# Patient Record
Sex: Female | Born: 1955 | Race: Asian | Hispanic: No | Marital: Married | Smoking: Never smoker
Health system: Southern US, Community
[De-identification: ages and names within clinical notes are randomized; demographics above are authoritative.]

## PROBLEM LIST (undated history)

## (undated) DIAGNOSIS — M1990 Unspecified osteoarthritis, unspecified site: Secondary | ICD-10-CM

## (undated) DIAGNOSIS — K219 Gastro-esophageal reflux disease without esophagitis: Secondary | ICD-10-CM

## (undated) HISTORY — DX: Gastro-esophageal reflux disease without esophagitis: K21.9

## (undated) HISTORY — DX: Unspecified osteoarthritis, unspecified site: M19.90

---

## 1999-12-22 ENCOUNTER — Inpatient Hospital Stay (HOSPITAL_COMMUNITY): Admission: AD | Admit: 1999-12-22 | Discharge: 1999-12-25 | Payer: Self-pay | Admitting: *Deleted

## 2000-01-30 ENCOUNTER — Other Ambulatory Visit: Admission: RE | Admit: 2000-01-30 | Discharge: 2000-01-30 | Payer: Self-pay | Admitting: *Deleted

## 2004-06-20 ENCOUNTER — Ambulatory Visit: Payer: Self-pay | Admitting: Family Medicine

## 2004-08-20 ENCOUNTER — Ambulatory Visit: Payer: Self-pay | Admitting: Family Medicine

## 2004-09-11 ENCOUNTER — Ambulatory Visit: Payer: Self-pay | Admitting: Family Medicine

## 2004-12-03 ENCOUNTER — Ambulatory Visit: Payer: Self-pay | Admitting: Family Medicine

## 2005-02-24 ENCOUNTER — Ambulatory Visit: Payer: Self-pay | Admitting: Family Medicine

## 2005-10-25 ENCOUNTER — Emergency Department (HOSPITAL_COMMUNITY): Admission: EM | Admit: 2005-10-25 | Discharge: 2005-10-25 | Payer: Self-pay | Admitting: Family Medicine

## 2005-10-28 ENCOUNTER — Emergency Department (HOSPITAL_COMMUNITY): Admission: EM | Admit: 2005-10-28 | Discharge: 2005-10-28 | Payer: Self-pay | Admitting: Family Medicine

## 2006-05-04 ENCOUNTER — Emergency Department (HOSPITAL_COMMUNITY): Admission: EM | Admit: 2006-05-04 | Discharge: 2006-05-04 | Payer: Self-pay | Admitting: Emergency Medicine

## 2006-11-22 ENCOUNTER — Emergency Department (HOSPITAL_COMMUNITY): Admission: EM | Admit: 2006-11-22 | Discharge: 2006-11-23 | Payer: Self-pay | Admitting: Emergency Medicine

## 2010-07-01 ENCOUNTER — Emergency Department (HOSPITAL_COMMUNITY)
Admission: EM | Admit: 2010-07-01 | Discharge: 2010-07-01 | Payer: Self-pay | Source: Home / Self Care | Admitting: Emergency Medicine

## 2010-10-28 LAB — CBC
MCH: 25.8 pg — ABNORMAL LOW (ref 26.0–34.0)
MCHC: 33.8 g/dL (ref 30.0–36.0)
MCV: 76.4 fL — ABNORMAL LOW (ref 78.0–100.0)
Platelets: 276 10*3/uL (ref 150–400)
RBC: 5.81 MIL/uL — ABNORMAL HIGH (ref 3.87–5.11)

## 2010-10-28 LAB — COMPREHENSIVE METABOLIC PANEL
BUN: 7 mg/dL (ref 6–23)
CO2: 24 mEq/L (ref 19–32)
Calcium: 9.8 mg/dL (ref 8.4–10.5)
Chloride: 105 mEq/L (ref 96–112)
Creatinine, Ser: 0.73 mg/dL (ref 0.4–1.2)
GFR calc non Af Amer: >60 mL/min (ref >60)
Total Bilirubin: 1 mg/dL (ref 0.3–1.2)

## 2010-10-28 LAB — POCT URINALYSIS DIPSTICK
Bilirubin Urine: NEGATIVE
Nitrite: NEGATIVE
Protein, ur: NEGATIVE mg/dL
pH: 7 (ref 5.0–8.0)

## 2010-10-28 LAB — D-DIMER, QUANTITATIVE: D-Dimer, Quant: <0.22 ug/mL-FEU (ref 0.00–0.48)

## 2010-10-28 LAB — DIFFERENTIAL
Basophils Absolute: 0 10*3/uL (ref 0.0–0.1)
Lymphocytes Relative: 27 % (ref 12–46)
Lymphs Abs: 2.4 10*3/uL (ref 0.7–4.0)
Neutro Abs: 5.9 10*3/uL (ref 1.7–7.7)

## 2010-10-28 LAB — LIPASE, BLOOD: Lipase: 27 U/L (ref 11–59)

## 2011-01-02 NOTE — Op Note (Signed)
[facility]  Patient:    Debbie Proctor, Debbie Proctor                              MRN: [id number] Proc. Date: 12/22/99 Adm. Date:  [id number] Attending:  Donne Hazel                           Operative Report  PREOPERATIVE DIAGNOSIS:       Intrauterine pregnancy at term.  Breech presentation.  POSTOPERATIVE DIAGNOSIS:      Intrauterine pregnancy at term.  Breech presentation.  OPERATION:                    Primary low transverse cesarean section.  SURGEON:                      Willey Blade, M.D.  ASSISTANT:  ANESTHESIA:                   Spinal.  ESTIMATED BLOOD LOSS:         800 cc.  COMPLICATIONS:                None.  FINDINGS:                     At 0815 through a low transverse uterine incision, a viable female infant was delivered from the frank breech presentation.  The baby as a female weighing 6 pounds 13 ounces.  The babys Apgars were 8 and 9.  The pelvis was visualized at the time of surgery and noted to be normal.  DESCRIPTION OF PROCEDURE:     The patient was taken to the operating room where a spinal anesthetic was administered.  The patient was placed on the operating table in the left lateral tilt position.  The abdomen was prepped and draped in the usual sterile fashion with Betadine and sterile drapes.  A Foley catheter was sterilely inserted.  An adequate level of spinal anesthesia was identified.  Next, the abdomen was entered through a Pfannenstiel incision and carried down sharply in the usual fashion.  The peritoneum was atraumatically entered.  The vesicouterine peritoneum overlying the lower uterine segment was incised and a bladder flap was bluntly and sharply created over the lower uterine segment.  Next, a bladder blade was placed behind the bladder.  The uterus was entered through a low transverse  incision and carried out laterally using the operators fingers.  The membranes were entered with clear fluid noted.  The  frank breech was encountered and elevated into the incision and delivered promptly and easily at 0815 with the usual breech maneuvers.  The baby did well.  The oral and nasopharynx were thoroughly bulb suctioned, the cord doubly clamped and cut, and the baby handed promptly to the  pediatricians.  The baby had a strong spontaneous cry.  The baby was a female weighing 6 pounds 13 ounces, Apgars were 8 and 9.  The placenta was then manually extracted intact with three vessel cord without difficulty.  The interior of the uterus was wiped clean with a wet sponge.  The  uterine incision was then closed in a two layer fashion, the first layer a running interlocking suture of #1 Vicryl.  A second imbricating suture was placed across the primary suture line with a running stitch of #1 Vicryl as  well.  Good hemostasis was noted.  The pelvis was then thoroughly irrigated with copious amounts of irrigant.  The pelvis was visualized and noted to be normal.  Good hemostasis was once again noted and attention then turned to closure.  The rectus muscle and anterior peritoneum was closed with a running stitch of #1 Vicryl. The fascia was then closed with two sutures of #1 Vicryl, this was done by placing wo sutures of #1 Vicryl at the periphery and with a running stitch 0.2 meeting in he midline.  The subfascial areas were of course hemostatic before this.  Good hemostasis was noted in the subcutaneous tissue.  This was irrigated and hemostasis achieved with the cautery.  The skin reapproximated with staples and a sterile dressing applied.  Final sponge, needle, and instrument counts were correct x 3. The patient did receive an antibiotic after cord clamp. DD:  12/22/99 TD:  12/23/99 Job: [id number]

## 2011-01-02 NOTE — Discharge Summary (Signed)
[facility]  Patient:    ELSPETH, BLUCHER                              MRN: [id number] Adm. Date:  12/22/99 Disc. Date: 12/25/99 Attending:  Juluis Mire, M.D. Dictator:   Danie Chandler, R.N.                           Discharge Summary  ADMISSION DIAGNOSES:          1. Intrauterine pregnancy at term.                               2. Breech presentation.  DISCHARGE DIAGNOSES:          1. Intrauterine pregnancy at term.                               2. Breech presentation.  PROCEDURES:                   On Dec 22, 1999, primary low transverse cesarean section.  HISTORY OF PRESENT ILLNESS:   The patient is a 55 year old female, gravida 5, para 2, with an estimated date of confinement of Dec 24, 1999, who was admitted for a primary cesarean section secondary to breech presentation.  The patients pregnancy has been uncomplicated, except for advanced maternal age for which she declined work-up.  HOSPITAL COURSE:              The patient was taken to the operating room and underwent the above-named procedure without complication.  This was productive of a viable female infant with Apgars of 8 at one minute and 9 at five minutes. Postoperatively, the patient did well.  On postoperative day #1, the patients hemoglobin was 9.8, hematocrit 28.1, and white blood cell count 9.6.  The patient was started on iron daily.  On postoperative day #2, the patient was ambulating well without difficulty and had a good return of bowel function. She was tolerating a regular diet.  DISPOSITION:                  The patient was discharged home on postoperative day #3.  CONDITION ON DISCHARGE:       Good.  DIET:                         Regular as tolerated.  ACTIVITY:                     No heavy lifting, no driving, and no vaginal entry.  FOLLOW-UP:                    She is to follow up in the office in one to two weeks for incision check.  She is to call for temperature  greater than 100 degrees, persistent nausea or vomiting, heavy vaginal bleeding, and/or redness or drainage from the incision site.  DISCHARGE MEDICATIONS: 1. Prenatal vitamins one p.o. q.d. 2. Darvocet-N 100 one p.o. q.4h. p.r.n. pain. DD:  01/07/00 TD:  01/10/00 Job: [id number]

## 2012-08-25 ENCOUNTER — Encounter: Payer: Self-pay | Admitting: Family Medicine

## 2012-08-25 ENCOUNTER — Ambulatory Visit (INDEPENDENT_AMBULATORY_CARE_PROVIDER_SITE_OTHER): Payer: 59 | Admitting: Family Medicine

## 2012-08-25 VITALS — BP 130/80 | HR 83 | Ht 63.0 in | Wt 160.0 lb

## 2012-08-25 DIAGNOSIS — R3989 Other symptoms and signs involving the genitourinary system: Secondary | ICD-10-CM

## 2012-08-25 DIAGNOSIS — M25519 Pain in unspecified shoulder: Secondary | ICD-10-CM | POA: Insufficient documentation

## 2012-08-25 DIAGNOSIS — E669 Obesity, unspecified: Secondary | ICD-10-CM

## 2012-08-25 DIAGNOSIS — R109 Unspecified abdominal pain: Secondary | ICD-10-CM

## 2012-08-25 DIAGNOSIS — G5602 Carpal tunnel syndrome, left upper limb: Secondary | ICD-10-CM | POA: Insufficient documentation

## 2012-08-25 DIAGNOSIS — R39198 Other difficulties with micturition: Secondary | ICD-10-CM | POA: Insufficient documentation

## 2012-08-25 DIAGNOSIS — K219 Gastro-esophageal reflux disease without esophagitis: Secondary | ICD-10-CM | POA: Insufficient documentation

## 2012-08-25 DIAGNOSIS — G5600 Carpal tunnel syndrome, unspecified upper limb: Secondary | ICD-10-CM

## 2012-08-25 LAB — CBC WITH DIFFERENTIAL/PLATELET
Eosinophils Absolute: 0.2 10*3/uL (ref 0.0–0.7)
Eosinophils Relative: 1 % (ref 0–5)
Lymphs Abs: 2.9 10*3/uL (ref 0.7–4.0)
MCH: 25.2 pg — ABNORMAL LOW (ref 26.0–34.0)
MCHC: 34.3 g/dL (ref 30.0–36.0)
MCV: 73.4 fL — ABNORMAL LOW (ref 78.0–100.0)
Platelets: 323 10*3/uL (ref 150–400)
RBC: 5.56 MIL/uL — ABNORMAL HIGH (ref 3.87–5.11)

## 2012-08-25 LAB — COMPREHENSIVE METABOLIC PANEL
ALT: 13 U/L (ref 0–35)
AST: 18 U/L (ref 0–37)
Alkaline Phosphatase: 47 U/L (ref 39–117)
Glucose, Bld: 75 mg/dL (ref 70–99)
Sodium: 139 mEq/L (ref 135–145)
Total Bilirubin: 0.7 mg/dL (ref 0.3–1.2)
Total Protein: 8.2 g/dL (ref 6.0–8.3)

## 2012-08-25 LAB — POCT UA - MICROSCOPIC ONLY

## 2012-08-25 LAB — POCT URINALYSIS DIPSTICK
Glucose, UA: 100
Leukocytes, UA: NEGATIVE
Nitrite, UA: NEGATIVE
Protein, UA: NEGATIVE
Urobilinogen, UA: 0.2

## 2012-08-25 MED ORDER — DICLOFENAC SODIUM 1 % TD GEL: 2.0000 g | Freq: Four times a day (QID) | TRANSDERMAL | Status: DC

## 2012-08-25 MED ORDER — CYCLOBENZAPRINE HCL 10 MG PO TABS: 10.0000 mg | ORAL_TABLET | Freq: Every evening | ORAL | Status: DC | PRN

## 2012-08-25 MED ORDER — PANTOPRAZOLE SODIUM 40 MG PO TBEC: 40.0000 mg | DELAYED_RELEASE_TABLET | Freq: Every day | ORAL | Status: DC

## 2012-08-25 NOTE — Patient Instructions (Addendum)
The name of the gel to put on the back is called volteran gel, but if it is too expensive, you can use an over the counter medicine called aspercreme.  Also for the muscles, I am sending a muscle relaxant called flexeril to take at night time. You can continue taking ibuprofen but it might make the heartburn worst, so make sure and take it with food.   For the heartburn, I am sending a medicine called protonix to take once a day. If this is too expensive, you can get an over the counter medicine called ranitidine or zantac twice a day.   For the wrist pain, use the brace during the night.   I will call you if any of the lab work is abnormal.   Please come back in 1 month.

## 2012-08-25 NOTE — Assessment & Plan Note (Addendum)
Symptoms of burning after eating consistent with GERD. Will empirically treat with protonix and assess for improvement. If persists, consider H-pylori test. Follow up in 1 month.

## 2012-08-25 NOTE — Assessment & Plan Note (Signed)
Phallen and tinnel positive making carpal tunnel very likely etiology for numbness and tingling in left hand.  Fitted patient for wrist brace for stabilization of wrist. Use at night time and during the day, if able to tolerate wrist brace.

## 2012-08-25 NOTE — Assessment & Plan Note (Signed)
Bilateral shoulder and upper back pain. No neuro findings. Trapezius muscles were tight bilaterally. Recommended exercises for strengthening of the rotator cuff. Rx for flexeril 10mg  qhs/prn and for voltaren gel. If too expensive, aspercreme. Ibuprofen as needed although did caution of increase possibility of heartburn symptoms. Recommended food with NSAID.

## 2012-08-25 NOTE — Assessment & Plan Note (Addendum)
No associated incontinence. Problem has been ongoing for over one year with some more recent worsening of symptoms. Will obtain urine for any acute findings. Follow up in 1 month

## 2012-08-25 NOTE — Progress Notes (Signed)
Patient ID: Debbie Proctor, female   DOB: 1956/01/09, 57 y.o.   MRN: [id number]  Chief Complaint  Patient presents with  . NP  . bilat shoulder pain    HPI Debbie Proctor is a 57 y.o.female who presents as a new patient. presents with following concerns. - bilateral shoulder pain: since October. Worst with movement. Takes ibuprofen which has helped some. Massaging area helps. No weakness in arms. Was see for this and was told it was from muscle strain.  - tingling in left hand: in first four fingers, not pinky. Since October. 5-10 minutes. Every day. Better with massaging. Worst in the morning when waking up.  - heartburn: worst after eating.radiates to back at times No vomiting. Some dizziness. Taking tums, but doesn't help. Has had heartburn since 2008.   Does have some constipation. BM normally once a day. Goes in small amounts. Brown color. No blood.  - discomfort when she tries to urinate. Urinates only small amount. Over a year. Past Medical History  Diagnosis Date  . GERD (gastroesophageal reflux disease)     Past Surgical History  Procedure Date  . Cesarean section 2001    baby was breeched    No family history on file.  Social History History  Substance Use Topics  . Smoking status: Never Smoker   . Smokeless tobacco: Not on file  . Alcohol Use: No    Allergies not on file  Current Outpatient Prescriptions  Medication Sig Dispense Refill  . cyclobenzaprine (FLEXERIL) 10 MG tablet Take 1 tablet (10 mg total) by mouth at bedtime as needed for muscle spasms.  30 tablet  0  . diclofenac sodium (VOLTAREN) 1 % GEL Apply 2 g topically 4 (four) times daily.  1 Tube  0  . pantoprazole (PROTONIX) 40 MG tablet Take 1 tablet (40 mg total) by mouth daily.  30 tablet  3    Review of Systems Review of Systems Negative except per HPI Blood pressure 130/80, pulse 83, height 5\' 3"  (1.6 m), weight 160 lb (72.576 kg).  Physical Exam Physical Exam Physical Examination: General appearance - alert,  well appearing, and in no distress Chest - clear to auscultation, no wheezes, rales or rhonchi, symmetric air entry Heart - normal rate, regular rhythm, normal S1, S2, no murmurs, rubs, clicks or gallops Abdomen - soft, non distended, present bowel sounds, epigastric tenderness to palpation Back exam - tight trapezius bilaterally, tenderness to palpation along traps. No cervical spine point tenderness, normal shoulder range of motion, 5/5 strength in upper extremities bilaterally, some discomfort with scratch test.  Wrist: positive phallen and tinnel in left wrist.    Data Reviewed None available  Assessment    See problem list    Plan    See problem list       Marena Chancy 08/25/2012, 8:53 PM

## 2012-09-09 ENCOUNTER — Encounter: Payer: Self-pay | Admitting: Family Medicine

## 2012-09-09 ENCOUNTER — Ambulatory Visit (INDEPENDENT_AMBULATORY_CARE_PROVIDER_SITE_OTHER): Payer: 59 | Admitting: Family Medicine

## 2012-09-09 VITALS — BP 132/87 | HR 73 | Ht 63.0 in | Wt 160.5 lb

## 2012-09-09 DIAGNOSIS — Z309 Encounter for contraceptive management, unspecified: Secondary | ICD-10-CM

## 2012-09-09 DIAGNOSIS — Z30017 Encounter for initial prescription of implantable subdermal contraceptive: Secondary | ICD-10-CM

## 2012-09-09 DIAGNOSIS — IMO0001 Reserved for inherently not codable concepts without codable children: Secondary | ICD-10-CM

## 2012-09-09 DIAGNOSIS — Z3046 Encounter for surveillance of implantable subdermal contraceptive: Secondary | ICD-10-CM

## 2012-09-09 MED ORDER — ETONOGESTREL 68 MG ~~LOC~~ IMPL
68.0000 mg | DRUG_IMPLANT | Freq: Once | SUBCUTANEOUS | Status: AC
  Administered 2012-09-09: 68 mg via SUBCUTANEOUS

## 2012-09-10 NOTE — Progress Notes (Signed)
Patient ID: Debbie Proctor    DOB: 1956-04-04, 57 y.o.   MRN: [id number] --- Subjective:  Debbie Proctor is a 57 y.o.female who presents for removal and insertion of nexplanon.  Patient states that she is not actually 57yo and was assigned a birth date when she moved to the [place]. She is in fact still having regular periods, q month, while on the implanon and was having regular period prior to insertion 3 years ago.(Implanon inserted in July 2010). LMP: start: 01/10-01/17   ROS: see HPI Past Medical History: reviewed and updated medications and allergies. Social History: Tobacco: none  Objective: Filed Vitals:   09/09/12 1600  BP: 132/87  Pulse: 73   Implanon or Nexplanon Removal  Patient given informed consent for removal of her Nexplanon.  Signed copy in the chart.  Time out recorded.  Implanon site identified and able to be palpated.   Area prepped in usual sterile fashon.  One cc of 1% lidocaine was used to anesthetize the area at the distal end of the implant. A small stab incision was made near implant on the distal portion.   The implanon rod was grasped using hemostats and removed without difficulty.   Rod measured at 4cm to ensure complete removal.   Steri-strips were applied over the small incision.   A pressure bandage was applied to reduce any bruising.   There was less than 3 cc blood loss. There were no complications.  The patient tolerated the procedure well and was given post procedure instructions.   Nexplanon Insertion  Indications, contraindications, side effects, complications reviewed.  Non-dominant arm identified and marked for insertion. Time out performed.  Nexplanon was inserted in prior incision made for removal of implanon.  Implanon inserted as per package insert.   Patient tolerated procedure well. Negligible blood loss.  Steri-strip used to close site, pressure bandage applied.  Provider and patient able to palpate rod.  Given aftercare  instructions.

## 2012-09-11 DIAGNOSIS — Z30017 Encounter for initial prescription of implantable subdermal contraceptive: Secondary | ICD-10-CM | POA: Insufficient documentation

## 2012-09-11 NOTE — Assessment & Plan Note (Signed)
implanon inserted in July 2010 was removed.  Question regarding nexplanon insertion came up regarding patient's age, listed as 57 yo. It turns out that this birth date was randomly chosen when patient came to [place]. Patient thinks she really is in her mid 61's. With patient's report of having monthly bleeding both before implanon was insrted 3 years ago, as well as during the time that implanon has been in, it doesn't appear that patient is menopausal or peri-menopausal. Discussed with Dr. Earnest Bailey and nexplanon insertion is reasonable.

## 2012-09-27 ENCOUNTER — Ambulatory Visit (INDEPENDENT_AMBULATORY_CARE_PROVIDER_SITE_OTHER): Payer: 59 | Admitting: Family Medicine

## 2012-09-27 ENCOUNTER — Encounter: Payer: Self-pay | Admitting: Family Medicine

## 2012-09-27 VITALS — BP 148/93 | HR 85 | Ht 63.0 in | Wt 158.0 lb

## 2012-09-27 DIAGNOSIS — K219 Gastro-esophageal reflux disease without esophagitis: Secondary | ICD-10-CM

## 2012-09-27 DIAGNOSIS — R12 Heartburn: Secondary | ICD-10-CM

## 2012-09-27 DIAGNOSIS — M25519 Pain in unspecified shoulder: Secondary | ICD-10-CM

## 2012-09-27 LAB — POCT H PYLORI SCREEN: H Pylori Screen, POC: NEGATIVE

## 2012-09-27 MED ORDER — TRAMADOL HCL 50 MG PO TABS: 50.0000 mg | ORAL_TABLET | Freq: Four times a day (QID) | ORAL | Status: DC | PRN

## 2012-09-27 MED ORDER — CYCLOBENZAPRINE HCL 10 MG PO TABS: 10.0000 mg | ORAL_TABLET | Freq: Every evening | ORAL | Status: DC | PRN

## 2012-09-27 MED ORDER — PANTOPRAZOLE SODIUM 40 MG PO TBEC: 40.0000 mg | DELAYED_RELEASE_TABLET | Freq: Every day | ORAL | Status: DC

## 2012-09-27 NOTE — Progress Notes (Signed)
Patient ID: Debbie Proctor    DOB: 08-15-1956, 57 y.o.   MRN: [id number] --- Subjective:  Debbie Proctor is a 57 y.o.female (age not certain and not 56yo as stated in medical record) who presents for follow up on abdominal pain and left shoulder/neck pain.  - heartburn: better with the protonix but not completely resolved. Worst after eating. Worst with spicy foods. No nausea, no vomiting. Discomfort is described as burning sensation. No stuck food. No melena.   - left neck pain and shoulder pain: improved with flexeril but not entirely gone. Tingling in left hand improved as well.  Wearing wrist splint for carpel tunnel which also seems to improve. Ran out of flexeril last Friday and has noticed recurrence of discomfort. No hand weakness, no loss of sensation. Improved range of motion.   ROS: see HPI Past Medical History: reviewed and updated medications and allergies. Social History: Tobacco: none  Objective: Filed Vitals:   09/27/12 1601  BP: 148/93  Pulse: 85   Repeat 130/84  Physical Examination:   General appearance - alert, well appearing, and in no distress Chest - clear to auscultation, no wheezes, rales or rhonchi, symmetric air entry Heart - normal rate, regular rhythm, normal S1, S2, no murmurs, rubs, clicks or gallops Abdomen - soft, tender along epigastric and left upper quadrant region, negative Murphy's, no masses, no organomegally MSK - normal range of motion of shoulders, tenderness along left trapezius, positive left spurling. No upper extremity weakness

## 2012-09-27 NOTE — Patient Instructions (Addendum)
For the shoulder pain, continue with the flexeril. I am adding a pain medicine called tramadol that you can take every 6 hours as needed for pain.   For the abdominal pain, I am sending some more medicine. The bacteria test was negative. I would like to get an ultrasound to make sure that everything is ok with your gallbladder.

## 2012-09-29 NOTE — Assessment & Plan Note (Signed)
Improved with flexeril. There could be a cervical component to pain given positive spurling's. Will add tramadol to regimen and monitor improvement. volteran gel as needed.

## 2012-09-29 NOTE — Assessment & Plan Note (Signed)
H-pylori negative. Given some tenderness along RUQ, will obtain RUQ ultrasound. Refill protonix.

## 2012-10-03 ENCOUNTER — Other Ambulatory Visit: Payer: 59

## 2012-10-04 ENCOUNTER — Other Ambulatory Visit: Payer: 59

## 2012-11-18 ENCOUNTER — Other Ambulatory Visit: Payer: 59

## 2012-11-24 ENCOUNTER — Ambulatory Visit
Admission: RE | Admit: 2012-11-24 | Discharge: 2012-11-24 | Disposition: A | Discharge status: Home / Self Care | Payer: 59 | Source: Ambulatory Visit | Attending: Family Medicine | Admitting: Family Medicine

## 2012-11-24 DIAGNOSIS — R12 Heartburn: Secondary | ICD-10-CM

## 2012-12-13 ENCOUNTER — Encounter: Payer: Self-pay | Admitting: Family Medicine

## 2013-02-10 ENCOUNTER — Ambulatory Visit (INDEPENDENT_AMBULATORY_CARE_PROVIDER_SITE_OTHER): Payer: 59 | Admitting: Family Medicine

## 2013-02-10 ENCOUNTER — Encounter: Payer: Self-pay | Admitting: Family Medicine

## 2013-02-10 VITALS — BP 135/88 | HR 66 | Ht 63.0 in | Wt 153.0 lb

## 2013-02-10 DIAGNOSIS — K219 Gastro-esophageal reflux disease without esophagitis: Secondary | ICD-10-CM

## 2013-02-10 DIAGNOSIS — G5600 Carpal tunnel syndrome, unspecified upper limb: Secondary | ICD-10-CM

## 2013-02-10 DIAGNOSIS — G5602 Carpal tunnel syndrome, left upper limb: Secondary | ICD-10-CM

## 2013-02-10 MED ORDER — POLYETHYLENE GLYCOL 3350 17 GM/SCOOP PO POWD: 17.0000 g | Freq: Every day | ORAL | Status: DC

## 2013-02-10 MED ORDER — PANTOPRAZOLE SODIUM 40 MG PO TBEC: 40.0000 mg | DELAYED_RELEASE_TABLET | Freq: Two times a day (BID) | ORAL | Status: DC

## 2013-02-10 MED ORDER — PANTOPRAZOLE SODIUM 40 MG PO TBEC: 40.0000 mg | DELAYED_RELEASE_TABLET | Freq: Every day | ORAL | Status: DC

## 2013-02-10 NOTE — Assessment & Plan Note (Signed)
Stopped wearing brace once symptoms improved. Now having recurrence of symptoms. Recommended wearing brace nightly. If this doesn't help, will refer for steroid injection.

## 2013-02-10 NOTE — Patient Instructions (Signed)
Call me if the abdominal pain is worst with the medicine.;   Call me if the pain in your wrist gets worst.

## 2013-02-10 NOTE — Progress Notes (Signed)
Patient ID: Debbie Proctor    DOB: 1955-10-02, 57 y.o.   MRN: [id number] --- Subjective:  Debbie Proctor is a 57 y.o.female who presents for follow up on abdominal pain and ultrasound results.  - GERD: patient with reflux symptoms since 2008. It is burning in nature, worst a couple of hours after eating.  No nausea or vomiting. No melanous stools. No recent weight loss. No feeling of food getting stuck. Some relief with medicine, lasts for 3-4 hours.   - Numbness and tingling in left hand: tips of fingers 1-4. No weakness. Worst at night time. Wakes up with it. She was using her wrist brace every night. Once the pain got better, she stopped wearing the brace and now has new tingling.  She is Right handed  ROS: see HPI Past Medical History: reviewed and updated medications and allergies. Social History: Tobacco: none  Objective: Filed Vitals:   02/10/13 1007  BP: 135/88  Pulse: 66    Physical Examination:   General appearance - alert, well appearing, and in no distress Abdomen - soft, tender in the epigastric region, negative Murphy's, no rebound, no guarding, no palpable mass.  Hand - left hand: normal interdigit strength, normal grip strength bilaterally Positive Tinnell's, negative Phallen's.

## 2013-02-10 NOTE — Assessment & Plan Note (Addendum)
No red flags at this time. Recurrence of symptoms off of PPI. Will start her back on pronix 40 bid and see how she does. Discussed possibility of GI referral for her for endoscope, but she is not too eager about this at this time.  She also has some associated constipation and will prescribe miralax.  Follow up in 1 month.

## 2013-08-17 ENCOUNTER — Emergency Department (HOSPITAL_COMMUNITY): Payer: 59

## 2013-08-17 ENCOUNTER — Emergency Department (HOSPITAL_COMMUNITY)
Admission: EM | Admit: 2013-08-17 | Discharge: 2013-08-17 | Disposition: A | Discharge status: Home / Self Care | Payer: 59 | Attending: Emergency Medicine | Admitting: Emergency Medicine

## 2013-08-17 ENCOUNTER — Encounter (HOSPITAL_COMMUNITY): Payer: Self-pay | Admitting: Emergency Medicine

## 2013-08-17 DIAGNOSIS — R51 Headache: Secondary | ICD-10-CM | POA: Insufficient documentation

## 2013-08-17 DIAGNOSIS — J111 Influenza due to unidentified influenza virus with other respiratory manifestations: Secondary | ICD-10-CM | POA: Insufficient documentation

## 2013-08-17 DIAGNOSIS — R6889 Other general symptoms and signs: Secondary | ICD-10-CM

## 2013-08-17 DIAGNOSIS — R079 Chest pain, unspecified: Secondary | ICD-10-CM | POA: Insufficient documentation

## 2013-08-17 DIAGNOSIS — Z8719 Personal history of other diseases of the digestive system: Secondary | ICD-10-CM | POA: Insufficient documentation

## 2013-08-17 DIAGNOSIS — R197 Diarrhea, unspecified: Secondary | ICD-10-CM | POA: Insufficient documentation

## 2013-08-17 DIAGNOSIS — R209 Unspecified disturbances of skin sensation: Secondary | ICD-10-CM | POA: Insufficient documentation

## 2013-08-17 DIAGNOSIS — R110 Nausea: Secondary | ICD-10-CM | POA: Insufficient documentation

## 2013-08-17 DIAGNOSIS — Z79899 Other long term (current) drug therapy: Secondary | ICD-10-CM | POA: Insufficient documentation

## 2013-08-17 LAB — BASIC METABOLIC PANEL
BUN: 6 mg/dL (ref 6–23)
CALCIUM: 9.4 mg/dL (ref 8.4–10.5)
CO2: 24 meq/L (ref 19–32)
CREATININE: 0.61 mg/dL (ref 0.50–1.10)
Chloride: 100 mEq/L (ref 96–112)
GFR calc Af Amer: >90 mL/min (ref >90)
GLUCOSE: 113 mg/dL — AB (ref 70–99)
Potassium: 3.7 mEq/L (ref 3.7–5.3)
SODIUM: 138 meq/L (ref 137–147)

## 2013-08-17 LAB — CBC WITH DIFFERENTIAL/PLATELET
BASOS PCT: 0 % (ref 0–1)
Basophils Absolute: 0 10*3/uL (ref 0.0–0.1)
EOS ABS: 0 10*3/uL (ref 0.0–0.7)
EOS PCT: 0 % (ref 0–5)
HCT: 41.4 % (ref 36.0–46.0)
HEMOGLOBIN: 14.1 g/dL (ref 12.0–15.0)
LYMPHS ABS: 1 10*3/uL (ref 0.7–4.0)
Lymphocytes Relative: 9 % — ABNORMAL LOW (ref 12–46)
MCH: 25.1 pg — AB (ref 26.0–34.0)
MCHC: 34.1 g/dL (ref 30.0–36.0)
MCV: 73.7 fL — AB (ref 78.0–100.0)
MONO ABS: 0.9 10*3/uL (ref 0.1–1.0)
Monocytes Relative: 8 % (ref 3–12)
NEUTROS ABS: 9.4 10*3/uL — AB (ref 1.7–7.7)
NEUTROS PCT: 83 % — AB (ref 43–77)
Platelets: 281 10*3/uL (ref 150–400)
RBC: 5.62 MIL/uL — AB (ref 3.87–5.11)
RDW: 13.7 % (ref 11.5–15.5)
WBC: 11.3 10*3/uL — ABNORMAL HIGH (ref 4.0–10.5)

## 2013-08-17 LAB — CG4 I-STAT (LACTIC ACID): Lactic Acid, Venous: 0.96 mmol/L (ref 0.5–2.2)

## 2013-08-17 MED ORDER — OSELTAMIVIR PHOSPHATE 75 MG PO CAPS: 75.0000 mg | ORAL_CAPSULE | Freq: Two times a day (BID) | ORAL | Status: AC

## 2013-08-17 MED ORDER — SODIUM CHLORIDE 0.9 % IV BOLUS (SEPSIS)
1000.0000 mL | Freq: Once | INTRAVENOUS | Status: AC
Start: 2013-08-17 — End: 2013-08-17
  Administered 2013-08-17: 1000 mL via INTRAVENOUS

## 2013-08-17 MED ORDER — KETOROLAC TROMETHAMINE 30 MG/ML IJ SOLN
30.0000 mg | Freq: Once | INTRAMUSCULAR | Status: AC
  Administered 2013-08-17: 30 mg via INTRAVENOUS
  Filled 2013-08-17: qty 1

## 2013-08-17 MED ORDER — ACETAMINOPHEN 325 MG PO TABS
650.0000 mg | ORAL_TABLET | Freq: Four times a day (QID) | ORAL | Status: DC | PRN
  Administered 2013-08-17: 650 mg via ORAL

## 2013-08-17 NOTE — ED Provider Notes (Signed)
CSN: [id number]     Arrival date & time 08/17/13  1936 History   First MD Initiated Contact with Patient 08/17/13 2140     Chief Complaint  Patient presents with  . Fever   HPI  58 y/o female with no significant past medical who presents accompanied by her husband and niece with cc of runny nose, cough, headache, fever and chills. Symptoms began yesterday. Has had sick contacts with similar symptoms. Headache is currently a 10/10. Headache is generalized. She has been taking tylenol without relief. She denies any sick contacts. She denies any recent travel. Her history is limited secondary to dialect. The patient's niece provided translation. The patient was comfortable with this as they state their dialect is extremely rare. Received additional history by phone from the patient's daughter who lives in [place]. She also helped translate.   Past Medical History  Diagnosis Date  . GERD (gastroesophageal reflux disease)    Past Surgical History  Procedure Laterality Date  . Cesarean section  2001    baby was breeched   History reviewed. No pertinent family history. History  Substance Use Topics  . Smoking status: Never Smoker   . Smokeless tobacco: Not on file  . Alcohol Use: No   OB History   Grav Para Term Preterm Abortions TAB SAB Ect Mult Living                 Review of Systems  Constitutional: Positive for fever and chills.  HENT: Positive for congestion.   Respiratory: Positive for cough.   Cardiovascular: Positive for chest pain.  Gastrointestinal: Positive for nausea and diarrhea. Negative for vomiting and abdominal pain.  Genitourinary: Negative for dysuria and frequency.  Musculoskeletal: Negative for neck stiffness.  Neurological: Positive for numbness. Negative for weakness and headaches.  All other systems reviewed and are negative.    Allergies  Review of patient's allergies indicates no known allergies.  Home Medications   Current Outpatient Rx  Name   Route  Sig  Dispense  Refill  . guaifenesin (ROBITUSSIN) 100 MG/5ML syrup   Oral   Take 200 mg by mouth daily as needed for cough (and cold).         Marland Kitchen. oseltamivir (TAMIFLU) 75 MG capsule   Oral   Take 1 capsule (75 mg total) by mouth 2 (two) times daily.   10 capsule   0    BP 124/72  Pulse 86  Temp(Src) 98.6 F (37 C) (Oral)  Resp 14  SpO2 98% Physical Exam  Nursing note and vitals reviewed. Constitutional: She is oriented to person, place, and time. She appears well-developed and well-nourished. No distress.  HENT:  Head: Normocephalic and atraumatic.  Eyes: Pupils are equal, round, and reactive to light. Right conjunctiva is injected. Left conjunctiva is injected.  Neck: Normal range of motion and full passive range of motion without pain. Neck supple. No Brudzinski's sign noted.  Cardiovascular: Normal rate and regular rhythm.  Exam reveals no gallop and no friction rub.   No murmur heard. Pulmonary/Chest: Effort normal and breath sounds normal.  Abdominal: Soft. She exhibits no distension. There is no tenderness.  Musculoskeletal: Normal range of motion. She exhibits no edema and no tenderness.  Neurological: She is alert and oriented to person, place, and time. She has normal strength and normal reflexes. No cranial nerve deficit or sensory deficit.  Skin: Skin is warm and dry.  Psychiatric: She has a normal mood and affect.   ED Course  Procedures (including critical care time) Labs Review Labs Reviewed  CBC WITH DIFFERENTIAL - Abnormal; Notable for the following:    WBC 11.3 (*)    RBC 5.62 (*)    MCV 73.7 (*)    MCH 25.1 (*)    Neutrophils Relative % 83 (*)    Lymphocytes Relative 9 (*)    Neutro Abs 9.4 (*)    All other components within normal limits  BASIC METABOLIC PANEL - Abnormal; Notable for the following:    Glucose, Bld 113 (*)    All other components within normal limits  CG4 I-STAT (LACTIC ACID)   Imaging Review Dg Chest 2 View  08/17/2013    CLINICAL DATA:  Cough and chest congestion.  EXAM: CHEST  2 VIEW  COMPARISON:  11/22/2006  FINDINGS: The heart size and mediastinal contours are within normal limits. Low lung volumes noted. Both lungs are clear. The visualized skeletal structures are unremarkable.  IMPRESSION: No active cardiopulmonary disease.   Electronically Signed   By: Myles Rosenthal M.D.   On: 08/17/2013 20:25   EKG Interpretation   None      MDM   Here with runny nose, cough, headache, fever and chills x 1 day. VSS. No meningeal signs. Non-focal neuro exam. Doubt meningitis. CBC with mild leukocytosis. BMP unremarkable. Lactate normal. CXR without infiltrate. EKG without acute ischemic changes. Likely influenza. Tamiflu script provided. Instructed the patient to follow up with pcp in next 1-2 days if not improving. Return precautions given and discussed with the patient and her family who were in agreement with the plan.   1. Influenza-like symptoms         Shanon Ace, MD 08/17/13 2356

## 2013-08-17 NOTE — ED Notes (Addendum)
Presents with 24 hours of runny nose, cough, headache and fever and chills. Unsure of how high fever is, here 100.2. Bilateral breath sounds clear. Cough non productive. Pt alert and oriented.  Headache is worse when coughing.  Light and sound make headache worse.

## 2013-08-17 NOTE — ED Notes (Addendum)
pts niece translating for pt. States that pt has also been experiencing burning in her chest. States that pt has been feeling fatigued.

## 2013-08-18 NOTE — ED Provider Notes (Signed)
I saw and evaluated the patient, reviewed the resident's note and I agree with the findings and plan.   .Face to face Exam:  General:  Awake HEENT:  Atraumatic/neck is supple with no meningeal signs Resp:  Normal effort Abd:  Nondistended Neuro:No focal weakness   Nelia Shiobert L Jennylee Uehara, MD 08/18/13 2138

## 2013-08-25 ENCOUNTER — Ambulatory Visit (INDEPENDENT_AMBULATORY_CARE_PROVIDER_SITE_OTHER): Payer: 59 | Admitting: Family Medicine

## 2013-08-25 ENCOUNTER — Encounter: Payer: Self-pay | Admitting: Family Medicine

## 2013-08-25 VITALS — BP 150/82 | HR 72 | Temp 98.5°F | Ht 63.0 in | Wt 155.0 lb

## 2013-08-25 DIAGNOSIS — B9689 Other specified bacterial agents as the cause of diseases classified elsewhere: Secondary | ICD-10-CM

## 2013-08-25 DIAGNOSIS — J019 Acute sinusitis, unspecified: Secondary | ICD-10-CM

## 2013-08-25 DIAGNOSIS — R51 Headache: Secondary | ICD-10-CM

## 2013-08-25 MED ORDER — AMOXICILLIN-POT CLAVULANATE 875-125 MG PO TABS: 1.0000 | ORAL_TABLET | Freq: Two times a day (BID) | ORAL | Status: DC

## 2013-08-25 MED ORDER — KETOROLAC TROMETHAMINE 60 MG/2ML IM SOLN
60.0000 mg | Freq: Once | INTRAMUSCULAR | Status: AC
  Administered 2013-08-25: 60 mg via INTRAMUSCULAR

## 2013-08-25 NOTE — Patient Instructions (Signed)
It was nice to meet you. I think that you are still having symptoms caused by a viral infection. However, since it has been going on for over 10 days, it is reasonable to try an antibiotic to make sure that we are not missing a bacterial infection of your sinuses. If you feel better today, then do no start the antibiotic. If you feel the same, then start taking it and make sure that you take it for 7 days. Come back if you have trouble breathing.   Sincerely,   Dr. Clinton SawyerWilliamson

## 2013-08-25 NOTE — Progress Notes (Signed)
   Subjective:    Patient ID: Debbie Proctor Fatemah, female    DOB: July 29, 1956, 58 y.o.   MRN: [id number]  HPI  58 year old F with recent visit to ED on 08/17/13 with a flu-like illness. Not tested for flu, but prescribed Tamiflu presumptively. She presents today for f/u.   Today she presents with complaints headache and cough. She complete her tamiflu course. Overall, she is a little improved. She still has decreased appetite and shortness of breath. Headache is global and daily. It is worst in the morning. It does not affect her vision but makes it hard to open her eyes. She has not tried anything for relief. Other family members with cold symptoms.   Pt speaks [place] [employer] and niece is an Equities traderinterpreter.    Review of Systems See HPI    Objective:   Physical Exam BP 150/82  Pulse 72  Temp(Src) 98.5 F (36.9 C) (Oral)  Ht 5\' 3"  (1.6 m)  Wt 155 lb (70.308 kg)  BMI 27.46 kg/m2   Gen: mildly ill appearing but non toxic, non distressed  HEENT: NCAT, PERRLA, EOMI, neck ROM normal, no meningismus, OP clear and moist, no adenopathy; sinus tenderness of frontal and maxillary sinuses  CV: rrr, no mururs Lungs: CTA-B, normal WOB Neuro: CN II-XII intact      Assessment & Plan:  Persistent URI symptoms with concern for bacterial rhinosinusitis given duration. Will give Rx for augmentin and instructed to fill if symptoms not improving tomorrow. Also, given injection of toradol 60 mg IM for headache.

## 2014-10-05 ENCOUNTER — Encounter: Payer: Self-pay | Admitting: Family Medicine

## 2016-07-29 ENCOUNTER — Encounter: Payer: Self-pay | Admitting: Family Medicine

## 2016-07-29 ENCOUNTER — Ambulatory Visit (INDEPENDENT_AMBULATORY_CARE_PROVIDER_SITE_OTHER): Payer: 59 | Admitting: Family Medicine

## 2016-07-29 VITALS — BP 136/76 | HR 75 | Temp 98.4°F | Ht 63.0 in | Wt 154.2 lb

## 2016-07-29 DIAGNOSIS — Z3046 Encounter for surveillance of implantable subdermal contraceptive: Secondary | ICD-10-CM

## 2016-07-29 DIAGNOSIS — Z0000 Encounter for general adult medical examination without abnormal findings: Secondary | ICD-10-CM

## 2016-07-29 DIAGNOSIS — E663 Overweight: Secondary | ICD-10-CM

## 2016-07-29 LAB — POCT GLYCOSYLATED HEMOGLOBIN (HGB A1C): Hemoglobin A1C: 5.8

## 2016-07-29 NOTE — Assessment & Plan Note (Signed)
Advised on diet and exercise. 

## 2016-07-29 NOTE — Assessment & Plan Note (Signed)
Nexplanon was inserted and 08/2012 Patient is listed as being 60 years old, but she reports this was randomly chosen when she came to [place] and thinks that she is actually in her late 8440s  She reports she continues to have monthly periods It does not appear that the patient is menopausal or perimenopausal currently Would be reasonable to remove and reinsert her Nexplanon

## 2016-07-29 NOTE — Patient Instructions (Signed)

## 2016-07-29 NOTE — Progress Notes (Signed)
Subjective:   Debbie Proctor is a 60 y.o. female with a history of GERD, left carpal tunnel syndrome here for well woman/preventative visit. History is obtained from daughter translating in [place]  Acute Concerns:  Wants Nexplanon removed - It was placed in 2014 to replace another that had been placed in 2010 - Reports that she is not actually 60 years old but was assigned a birthdate when she moved to [place] - thinks that she is actually in her late 6240s - unsure of her actual DOB - still having regular periods - LMP 11/23 - come q1 month   Diet: rice is a staple, also eats veggies, meat, and fish  Exercise: no, walks a lot at work at [employer]  Sexual/Birth History: [id number], currently sexually active with 1 female partner  Birth Control: nexplanon as above  POA/Living Will: no   Review of Systems: Per HPI.    PMH, PSH, Medications, Allergies, and FmHx reviewed and updated in EMR.  Social History: never smoker  Immunization:  Tdap/TD: Reports that she is up-to-date  Influenza: got at work in 05/2016  Pneumococcal: n/a  Herpes Zoster: n/a  Cancer Screening:  Pap Smear: 2010 - normal - declines today  Mammogram: never  Colonoscopy: never  Dexa: n/a   Objective:  BP 136/76   Pulse 75   Temp 98.4 F (36.9 C) (Oral)   Ht 5\' 3"  (1.6 m)   Wt 154 lb 3.2 oz (69.9 kg)   LMP 07/09/2016 (Approximate)   SpO2 98%   BMI 27.32 kg/m   Gen:  60 y.o. female in NAD HEENT: NCAT, MMM, EOMI, PERRL, anicteric sclerae CV: RRR, no MRG Resp: Non-labored, CTAB, no wheezes noted Abd: Soft, NTND, BS present, no guarding or organomegaly Ext: WWP, no edema Gyn: patient declined MSK: No obvious deformities, gait intact Neuro: Alert and oriented, speech normal        Chemistry      Component Value Date/Time   NA 138 08/17/2013 1957   K 3.7 08/17/2013 1957   CL 100 08/17/2013 1957   CO2 24 08/17/2013 1958   BUN 6 08/17/2013 1958   CREATININE 0.61 08/17/2013 1958   CREATININE  0.55 08/25/2012 1623      Component Value Date/Time   CALCIUM 9.4 08/17/2013 1957   ALKPHOS 47 08/25/2012 1623   AST 18 08/25/2012 1623   ALT 13 08/25/2012 1623   BILITOT 0.7 08/25/2012 1623      Lab Results  Component Value Date   WBC 11.3 (H) 08/17/2013   HGB 14.1 08/17/2013   HCT 41.4 08/17/2013   MCV 73.7 (L) 08/17/2013   PLT 281 08/17/2013   No results found for: TSH Lab Results  Component Value Date   HGBA1C 5.8 07/29/2016    Assessment & Plan:     Debbie Proctor is a 60 y.o. female here for annual well woman/preventative exam and GYN exam.  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 Encourage patient to get these records and have them sent to our clinic Declined Pap smear today - advised on screening recommendations and reason for screening Given information on mammogram and colonoscopy as patient has never had these before Check HIV, hep C, lipid panel, A1c, BMP, CBC today  Overweight Advised on diet and exercise  Insertion of Nexplanon Nexplanon was inserted and 08/2012 Patient is listed as being 60 years old, but she reports this was randomly chosen when she came [place] and thinks that she is actually in her late 5340s  She reports she continues to have monthly periods It does not appear that the patient is menopausal or perimenopausal currently Would be reasonable to remove and reinsert her Nexplanon   Advised f/u appt for Nexplanon removal and re-insertion    Erasmo DownerAngela M Shalee Paolo, MD MPH PGY-3,  [facility] 07/29/2016  3:52 PM

## 2016-07-29 NOTE — Assessment & Plan Note (Addendum)
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

## 2016-07-30 ENCOUNTER — Encounter: Payer: Self-pay | Admitting: Family Medicine

## 2016-07-30 LAB — CBC
HCT: 42 % (ref 35.0–45.0)
Hemoglobin: 13.8 g/dL (ref 11.7–15.5)
MCH: 25 pg — AB (ref 27.0–33.0)
MCHC: 32.9 g/dL (ref 32.0–36.0)
MCV: 76.1 fL — AB (ref 80.0–100.0)
MPV: 9.2 fL (ref 7.5–12.5)
Platelets: 347 10*3/uL (ref 140–400)
RBC: 5.52 MIL/uL — AB (ref 3.80–5.10)
RDW: 14.1 % (ref 11.0–15.0)
WBC: 6.9 10*3/uL (ref 3.8–10.8)

## 2016-07-30 LAB — BASIC METABOLIC PANEL WITH GFR
BUN: 9 mg/dL (ref 7–25)
CALCIUM: 9.7 mg/dL (ref 8.6–10.4)
CO2: 26 mmol/L (ref 20–31)
CREATININE: 0.7 mg/dL (ref 0.50–1.05)
Chloride: 105 mmol/L (ref 98–110)
GFR, Est African American: >89 mL/min (ref >=60)
GFR, Est Non African American: >89 mL/min (ref >=60)
GLUCOSE: 83 mg/dL (ref 65–99)
Potassium: 4.3 mmol/L (ref 3.5–5.3)
Sodium: 141 mmol/L (ref 135–146)

## 2016-07-30 LAB — LIPID PANEL
CHOL/HDL RATIO: 6.1 ratio — AB (ref <5.0)
CHOLESTEROL: 189 mg/dL (ref <200)
HDL: 31 mg/dL — AB (ref >50)
LDL Cholesterol: 117 mg/dL — ABNORMAL HIGH (ref <100)
Triglycerides: 207 mg/dL — ABNORMAL HIGH (ref <150)
VLDL: 41 mg/dL — ABNORMAL HIGH (ref <30)

## 2016-07-30 LAB — HEPATITIS C ANTIBODY: HCV AB: NEGATIVE

## 2016-07-30 LAB — HIV ANTIBODY (ROUTINE TESTING W REFLEX): HIV 1&2 Ab, 4th Generation: NONREACTIVE

## 2016-07-30 NOTE — Progress Notes (Signed)
ASCVD 10 yr risk is 3.8% with official age  If patient is actually [age], which she thinks she may be (see last progress note), 10 yr risk is 2%  No statin indicated at this time  Erasmo DownerAngela M Seraiah Nowack, MD, MPH PGY-3,  [facility] 07/30/2016 12:12 PM

## 2016-07-31 ENCOUNTER — Ambulatory Visit: Payer: 59 | Admitting: Family Medicine

## 2016-08-12 ENCOUNTER — Ambulatory Visit (INDEPENDENT_AMBULATORY_CARE_PROVIDER_SITE_OTHER): Payer: 59 | Admitting: Family Medicine

## 2016-08-12 ENCOUNTER — Encounter: Payer: Self-pay | Admitting: Family Medicine

## 2016-08-12 VITALS — BP 118/78 | HR 79 | Temp 98.0°F | Wt 157.0 lb

## 2016-08-12 DIAGNOSIS — Z3042 Encounter for surveillance of injectable contraceptive: Secondary | ICD-10-CM

## 2016-08-12 DIAGNOSIS — Z30017 Encounter for initial prescription of implantable subdermal contraceptive: Secondary | ICD-10-CM | POA: Diagnosis not present

## 2016-08-12 DIAGNOSIS — Z3046 Encounter for surveillance of implantable subdermal contraceptive: Secondary | ICD-10-CM | POA: Diagnosis not present

## 2016-08-12 LAB — POCT URINE PREGNANCY: Preg Test, Ur: NEGATIVE

## 2016-08-12 NOTE — Progress Notes (Signed)
URIN 

## 2016-08-12 NOTE — Patient Instructions (Signed)
Laceration Care, Adult Introduction A laceration is a cut that goes through all layers of the skin. The cut also goes into the tissue that is right under the skin. Some cuts heal on their own. Others need to be closed with stitches (sutures), staples, skin adhesive strips, or wound glue. Taking care of your cut lowers your risk of infection and helps your cut to heal better. How to take care of your cut For stitches or staples:  Keep the wound clean and dry.  If you were given a bandage (dressing), you should change it at least one time per day or as told by your doctor. You should also change it if it gets wet or dirty.  Keep the wound completely dry for the first 24 hours or as told by your doctor. After that time, you may take a shower or a bath. However, make sure that the wound is not soaked in water until after the stitches or staples have been removed.  Clean the wound one time each day or as told by your doctor:  Wash the wound with soap and water.  Rinse the wound with water until all of the soap comes off.  Pat the wound dry with a clean towel. Do not rub the wound.  After you clean the wound, put a thin layer of antibiotic ointment on it as told by your doctor. This ointment:  Helps to prevent infection.  Keeps the bandage from sticking to the wound.  Have your stitches or staples removed as told by your doctor. If your doctor used skin adhesive strips:  Keep the wound clean and dry.  If you were given a bandage, you should change it at least one time per day or as told by your doctor. You should also change it if it gets dirty or wet.  Do not get the skin adhesive strips wet. You can take a shower or a bath, but be careful to keep the wound dry.  If the wound gets wet, pat it dry with a clean towel. Do not rub the wound.  Skin adhesive strips fall off on their own. You can trim the strips as the wound heals. Do not remove any strips that are still stuck to the wound.  They will fall off after a while. If your doctor used wound glue:  Try to keep your wound dry, but you may briefly wet it in the shower or bath. Do not soak the wound in water, such as by swimming.  After you take a shower or a bath, gently pat the wound dry with a clean towel. Do not rub the wound.  Do not do any activities that will make you really sweaty until the skin glue has fallen off on its own.  Do not apply liquid, cream, or ointment medicine to your wound while the skin glue is still on.  If you were given a bandage, you should change it at least one time per day or as told by your doctor. You should also change it if it gets dirty or wet.  If a bandage is placed over the wound, do not let the tape for the bandage touch the skin glue.  Do not pick at the glue. The skin glue usually stays on for 5-10 days. Then, it falls off of the skin. General Instructions  To help prevent scarring, make sure to cover your wound with sunscreen whenever you are outside after stitches are removed, after adhesive strips are removed,   or when wound glue stays in place and the wound is healed. Make sure to wear a sunscreen of at least 30 SPF.  Take over-the-counter and prescription medicines only as told by your doctor.  If you were given antibiotic medicine or ointment, take or apply it as told by your doctor. Do not stop using the antibiotic even if your wound is getting better.  Do not scratch or pick at the wound.  Keep all follow-up visits as told by your doctor. This is important.  Check your wound every day for signs of infection. Watch for:  Redness, swelling, or pain.  Fluid, blood, or pus.  Raise (elevate) the injured area above the level of your heart while you are sitting or lying down, if possible. Get help if:  You got a tetanus shot and you have any of these problems at the injection site:  Swelling.  Very bad pain.  Redness.  Bleeding.  You have a fever.  A wound  that was closed breaks open.  You notice a bad smell coming from your wound or your bandage.  You notice something coming out of the wound, such as wood or glass.  Medicine does not help your pain.  You have more redness, swelling, or pain at the site of your wound.  You have fluid, blood, or pus coming from your wound.  You notice a change in the color of your skin near your wound.  You need to change the bandage often because fluid, blood, or pus is coming from the wound.  You start to have a new rash.  You start to have numbness around the wound. Get help right away if:  You have very bad swelling around the wound.  Your pain suddenly gets worse and is very bad.  You notice painful lumps near the wound or on skin that is anywhere on your body.  You have a red streak going away from your wound.  The wound is on your hand or foot and you cannot move a finger or toe like you usually can.  The wound is on your hand or foot and you notice that your fingers or toes look pale or bluish. This information is not intended to replace advice given to you by your health care provider. Make sure you discuss any questions you have with your health care provider. Document Released: 01/20/2008 Document Revised: 01/09/2016 Document Reviewed: 07/30/2014  2017 Elsevier   Contraceptive Implant Information A contraceptive implant is a plastic rod that is inserted under your skin. It is usually inserted under the skin of your upper arm. It continually releases small amounts of progestin (synthetic progesterone) into your bloodstream. This prevents an egg from being released from your ovaries. It also thickens your cervical mucus to prevent sperm from entering the cervix, and it thins your uterine lining to prevent a fertilized egg from attaching to your uterus. Contraceptive implants can be effective for up to 3 years. They do not provide protection against sexually transmitted diseases (STDs).    The procedure to insert an implant usually takes about 10 minutes. There may be minor bruising, swelling, and discomfort at the insertion site for a couple days. The implant begins to work within the first day. Other contraceptive protection may be necessary for 7 days. Be sure to discuss with your health care provider if you need a backup method of contraception.  Your health care provider will make sure you are a good candidate for the contraceptive implant.  Discuss with your health care provider the possible side effects of the implant. ADVANTAGES  It prevents pregnancy for up to 3 years.  It is easily reversible.  It is convenient.  It can be used when breastfeeding.  It can be used by women who cannot take estrogen. DISADVANTAGES  You may have irregular or unplanned vaginal bleeding.  You may develop side effects, including headache, weight gain, acne, breast tenderness, or mood changes.  You may have tissue or nerve damage after insertion (rare).  It may be difficult and uncomfortable to remove.  Certain medicines may interfere with the effectiveness of the implant. REMOVAL OF IMPLANT The implant should be removed in 3 years or as directed by your health care provider. The implant's effect wears off in a few hours after removal. Your ability to get pregnant (fertility) may be restored in 1-2 weeks. A new implant can be inserted as soon as the old one is removed if desired. CONTRAINDICATIONS You should not get the implant if you are experiencing any of the following situations:  You are pregnant.  You have a history of breast cancer, osteoporosis, blood clots, heart disease, diabetes, high blood pressure, liver disease, tumors, or stroke.   You have undiagnosed vaginal bleeding.  You have a sensitivity to any part of the implant. This information is not intended to replace advice given to you by your health care provider. Make sure you discuss any questions you have with  your health care provider. Document Released: 07/23/2011 Document Revised: 04/05/2013 Document Reviewed: 01/30/2013 Elsevier Interactive Patient Education  2017 [employer]

## 2016-08-12 NOTE — Progress Notes (Signed)
   HPI  CC: Nexplanon Insertion Patient is here for contraceptive implant insertion. She is been doing well. No issues at this time. Her previous implant has since expired. She endorses good tolerance of her previous implants and no adverse side effects.  Of note: Patient is noted to be 60 years old in our EMR. However multiple areas within the recent encounters notes that she is actually much younger than this. Patient's case was discussed with me prior to this visit by her PCP. Patient is still having regular monthly menses and is thought to be in her 5140s.  Review of Systems    See HPI for ROS. All other systems reviewed and are negative.  CC, SH/smoking status, and VS noted  Objective: BP 118/78   Pulse 79   Temp 98 F (36.7 C) (Oral)   Wt 157 lb (71.2 kg)   LMP 07/09/2016 (Approximate)   BMI 27.81 kg/m  Gen: NAD, alert, cooperative, and pleasant CV: RRR, well perfused Resp:  non-labored Ext: No edema, warm, small incisional scar noted on the medial aspect of the left arm proximal to the medial epicondyle. Implant easily palpable proximal to the incisional scar.  PROCEDURE NOTE: NEXPLANON  REMOVAL Patient given informed consent and signed copy in the chart. Left arm area prepped and draped in the usual sterile fashion. 5 cc of lidocaine without epinephrine 1% used for local anesthesia. A small stab incision was made close to the nexplanon with scalpel. Hemostats were used to withdraw the nexplanon. And implant is removed completely without significant resistance.  PROCEDURE NOTE: NEXPLANON INSERTION Pregnancy test was negative. Patient's left arm was still prepped and draped from the removal process in the usual sterile manner. No additional anesthetic was used as patient was already anesthetized. Nexplanon was removed from packaging,  Device confirmed in needle, then inserted full length of needle and withdrawn per handbook instructions.  Pt insertion site covered with 2  Steri-Strips and compression bandage. Minimal blood loss.  Pt tolerated both procedures well.    Assessment and plan:  Insertion of Nexplanon Patient is here to have expired device removed from her left medial arm. Pregnancy test negative in clinic. Reinsertion of new contraceptive implant provided at the same visit. Patient tolerated well. Less than 5 mL blood loss. - Follow-up as needed. - Ibuprofen/Tylenol for soreness   Orders Placed This Encounter  Procedures  . POCT urine pregnancy     Kathee DeltonIan D McKeag, MD,MS,  PGY3 08/12/2016 5:13 PM

## 2016-08-12 NOTE — Assessment & Plan Note (Signed)
Patient is here to have expired device removed from her left medial arm. Pregnancy test negative in clinic. Reinsertion of new contraceptive implant provided at the same visit. Patient tolerated well. Less than 5 mL blood loss. - Follow-up as needed. - Ibuprofen/Tylenol for soreness

## 2016-08-13 MED ORDER — ETONOGESTREL 68 MG ~~LOC~~ IMPL
68.0000 mg | DRUG_IMPLANT | Freq: Once | SUBCUTANEOUS | Status: AC
  Administered 2016-08-12: 68 mg via SUBCUTANEOUS

## 2016-08-13 NOTE — Addendum Note (Signed)
Addended by: Jone BasemanFLEEGER, Deaunte Dente D on: 08/13/2016 12:44 PM   Modules accepted: Orders

## 2018-10-13 ENCOUNTER — Other Ambulatory Visit: Payer: Self-pay

## 2018-10-13 ENCOUNTER — Emergency Department (HOSPITAL_COMMUNITY)
Admission: EM | Admit: 2018-10-13 | Discharge: 2018-10-13 | Disposition: A | Discharge status: Home / Self Care | Payer: 59 | Attending: Emergency Medicine | Admitting: Emergency Medicine

## 2018-10-13 ENCOUNTER — Emergency Department (HOSPITAL_COMMUNITY): Payer: 59

## 2018-10-13 DIAGNOSIS — J101 Influenza due to other identified influenza virus with other respiratory manifestations: Secondary | ICD-10-CM

## 2018-10-13 DIAGNOSIS — R509 Fever, unspecified: Secondary | ICD-10-CM | POA: Diagnosis present

## 2018-10-13 LAB — INFLUENZA PANEL BY PCR (TYPE A & B)
INFLBPCR: NEGATIVE
Influenza A By PCR: POSITIVE — AB

## 2018-10-13 LAB — COMPREHENSIVE METABOLIC PANEL
ALT: 21 U/L (ref 0–44)
AST: 31 U/L (ref 15–41)
Albumin: 4.1 g/dL (ref 3.5–5.0)
Alkaline Phosphatase: 66 U/L (ref 38–126)
Anion gap: 13 (ref 5–15)
BILIRUBIN TOTAL: 1.9 mg/dL — AB (ref 0.3–1.2)
BUN: 7 mg/dL — AB (ref 8–23)
CO2: 23 mmol/L (ref 22–32)
Calcium: 9.5 mg/dL (ref 8.9–10.3)
Chloride: 102 mmol/L (ref 98–111)
Creatinine, Ser: 0.9 mg/dL (ref 0.44–1.00)
GFR calc Af Amer: >60 mL/min (ref >60)
GFR calc non Af Amer: >60 mL/min (ref >60)
Glucose, Bld: 155 mg/dL — ABNORMAL HIGH (ref 70–99)
POTASSIUM: 3.3 mmol/L — AB (ref 3.5–5.1)
Sodium: 138 mmol/L (ref 135–145)
TOTAL PROTEIN: 9.2 g/dL — AB (ref 6.5–8.1)

## 2018-10-13 LAB — CBC WITH DIFFERENTIAL/PLATELET
ABS IMMATURE GRANULOCYTES: 0.05 10*3/uL (ref 0.00–0.07)
BASOS PCT: 0 %
Basophils Absolute: 0 10*3/uL (ref 0.0–0.1)
Eosinophils Absolute: 0.1 10*3/uL (ref 0.0–0.5)
Eosinophils Relative: 1 %
HCT: 44.7 % (ref 36.0–46.0)
Hemoglobin: 14.4 g/dL (ref 12.0–15.0)
Immature Granulocytes: 0 %
Lymphocytes Relative: 13 %
Lymphs Abs: 1.5 10*3/uL (ref 0.7–4.0)
MCH: 24.2 pg — AB (ref 26.0–34.0)
MCHC: 32.2 g/dL (ref 30.0–36.0)
MCV: 75.1 fL — ABNORMAL LOW (ref 80.0–100.0)
MONO ABS: 1 10*3/uL (ref 0.1–1.0)
Monocytes Relative: 9 %
NEUTROS ABS: 9 10*3/uL — AB (ref 1.7–7.7)
Neutrophils Relative %: 77 %
Platelets: 319 10*3/uL (ref 150–400)
RBC: 5.95 MIL/uL — AB (ref 3.87–5.11)
RDW: 14.3 % (ref 11.5–15.5)
WBC: 11.7 10*3/uL — ABNORMAL HIGH (ref 4.0–10.5)
nRBC: 0 % (ref 0.0–0.2)

## 2018-10-13 LAB — LIPASE, BLOOD: LIPASE: 25 U/L (ref 11–51)

## 2018-10-13 LAB — LACTIC ACID, PLASMA: Lactic Acid, Venous: 1.3 mmol/L (ref 0.5–1.9)

## 2018-10-13 MED ORDER — SODIUM CHLORIDE 0.9 % IV BOLUS
1000.0000 mL | Freq: Once | INTRAVENOUS | Status: AC
  Administered 2018-10-13: 1000 mL via INTRAVENOUS

## 2018-10-13 MED ORDER — ONDANSETRON HCL 4 MG/2ML IJ SOLN
4.0000 mg | Freq: Once | INTRAMUSCULAR | Status: AC
  Administered 2018-10-13: 4 mg via INTRAVENOUS
  Filled 2018-10-13: qty 2

## 2018-10-13 MED ORDER — BENZONATATE 100 MG PO CAPS: 100.0000 mg | ORAL_CAPSULE | Freq: Three times a day (TID) | ORAL | 0 refills | Status: DC | PRN

## 2018-10-13 MED ORDER — ACETAMINOPHEN 325 MG PO TABS: 650.0000 mg | ORAL_TABLET | Freq: Four times a day (QID) | ORAL | 0 refills | Status: AC | PRN

## 2018-10-13 MED ORDER — OSELTAMIVIR PHOSPHATE 75 MG PO CAPS
75.0000 mg | ORAL_CAPSULE | Freq: Once | ORAL | Status: AC
  Administered 2018-10-13: 75 mg via ORAL
  Filled 2018-10-13: qty 1

## 2018-10-13 MED ORDER — ACETAMINOPHEN 325 MG PO TABS
650.0000 mg | ORAL_TABLET | Freq: Once | ORAL | Status: AC
  Administered 2018-10-13: 650 mg via ORAL
  Filled 2018-10-13: qty 2

## 2018-10-13 NOTE — ED Provider Notes (Signed)
[facility] EMERGENCY DEPARTMENT Provider Note   CSN: [id number] Arrival date & time: 10/13/18  0720    History   Chief Complaint Chief Complaint  Patient presents with  . Fever    HPI Debbie Proctor is a 63 y.o. female.     The history is provided by the patient and a relative. A language interpreter was used.  Fever   Debbie Proctor is a 63 y.o. female who presents to the Emergency Department complaining of fever, body aches.  Level V caveat due to language barrier. Hx is provided by patient with family interpreting as well as Hospital interpreter.  She began to feel poorly two days ago with bodyaches, sore throat, cough, HA, nausea, abdominal pain.  Sxs significantly worsened yesterday with subjective fevers.  No known sick contacts or foreign travel.  She does have decreased urinary output.  She has tried OTC cold medicines with no improvement in sxs. She was seen by urgent care yesterday and started on Tamiflu. She did not take it today but did take it is last night. She is also started on azithromycin and omeprazole. Past Medical History:  Diagnosis Date  . GERD (gastroesophageal reflux disease)     Patient Active Problem List   Diagnosis Date Noted  . Healthcare maintenance 07/29/2016  . Overweight 07/29/2016  . Insertion of Nexplanon 09/11/2012  . Carpal tunnel syndrome, left 08/25/2012  . GERD (gastroesophageal reflux disease) 08/25/2012    Past Surgical History:  Procedure Laterality Date  . CESAREAN SECTION  2001   baby was breeched     OB History   No obstetric history on file.      Home Medications    Prior to Admission medications   Medication Sig Start Date End Date Taking? Authorizing Provider  acetaminophen (TYLENOL) 325 MG tablet Take 2 tablets (650 mg total) by mouth every 6 (six) hours as needed for moderate pain or fever. 10/13/18   Tilden Fossa, MD  amoxicillin-clavulanate (AUGMENTIN) 875-125 MG per tablet Take 1 tablet by mouth 2  (two) times daily. 08/25/13   Garnetta Buddy, MD  benzonatate (TESSALON) 100 MG capsule Take 1 capsule (100 mg total) by mouth 3 (three) times daily as needed for cough. 10/13/18   Tilden Fossa, MD  guaifenesin (ROBITUSSIN) 100 MG/5ML syrup Take 200 mg by mouth daily as needed for cough (and cold).    [provider]    Family History No family history on file.  Social History Social History   Tobacco Use  . Smoking status: Never Smoker  Substance Use Topics  . Alcohol use: No  . Drug use: Not on file     Allergies   Patient has no known allergies.   Review of Systems Review of Systems  Constitutional: Positive for fever.  All other systems reviewed and are negative.    Physical Exam Updated Vital Signs BP 123/73   Pulse 90   Temp 98.4 F (36.9 C) (Oral)   Resp 19   SpO2 95%   Physical Exam Vitals signs and nursing note reviewed.  Constitutional:      Appearance: She is well-developed.  HENT:     Head: Normocephalic and atraumatic.  Neck:     Musculoskeletal: Neck supple.  Cardiovascular:     Rate and Rhythm: Regular rhythm.     Heart sounds: No murmur.     Comments: tachycardic Pulmonary:     Effort: Pulmonary effort is normal. No respiratory distress.  Breath sounds: Normal breath sounds.  Abdominal:     Palpations: Abdomen is soft.     Tenderness: There is no guarding or rebound.     Comments: Mild generalized abdominal tenderness  Musculoskeletal:        General: No swelling or tenderness.  Skin:    General: Skin is warm and dry.     Capillary Refill: Capillary refill takes less than 2 seconds.  Neurological:     Mental Status: She is alert and oriented to person, place, and time.  Psychiatric:        Behavior: Behavior normal.      ED Treatments / Results  Labs (all labs ordered are listed, but only abnormal results are displayed) Labs Reviewed  COMPREHENSIVE METABOLIC PANEL - Abnormal; Notable for the following  components:      Result Value   Potassium 3.3 (*)    Glucose, Bld 155 (*)    BUN 7 (*)    Total Protein 9.2 (*)    Total Bilirubin 1.9 (*)    All other components within normal limits  CBC WITH DIFFERENTIAL/PLATELET - Abnormal; Notable for the following components:   WBC 11.7 (*)    RBC 5.95 (*)    MCV 75.1 (*)    MCH 24.2 (*)    Neutro Abs 9.0 (*)    All other components within normal limits  INFLUENZA PANEL BY PCR (TYPE A & B) - Abnormal; Notable for the following components:   Influenza A By PCR POSITIVE (*)    All other components within normal limits  LIPASE, BLOOD  LACTIC ACID, PLASMA  URINALYSIS, ROUTINE W REFLEX MICROSCOPIC  LACTIC ACID, PLASMA    EKG EKG Interpretation  Date/Time:  Thursday October 13 2018 08:05:56 EST Ventricular Rate:  104 PR Interval:    QRS Duration: 86 QT Interval:  353 QTC Calculation: 465 R Axis:   -35 Text Interpretation:  Sinus tachycardia Left axis deviation RSR' in V1 or V2, probably normal variant Consider anterior infarct No significant change since last tracing Confirmed by Tilden Fossa [phone]) on 10/13/2018 8:13:33 AM Also confirmed by Tilden Fossa [phone]), editor Sheppard Evens ([id number])  on 10/13/2018 9:45:53 AM   Radiology Dg Chest 2 View  Result Date: 10/13/2018 CLINICAL DATA:  Cough and fever EXAM: CHEST - 2 VIEW COMPARISON:  August 17, 2013 FINDINGS: There is no appreciable edema or consolidation. The heart size and pulmonary vascularity are normal. No adenopathy. No pneumothorax. No bone lesions. IMPRESSION: No edema or consolidation. Electronically Signed   By: Bretta Bang III M.D.   On: 10/13/2018 08:56    Procedures Procedures (including critical care time)  Medications Ordered in ED Medications  sodium chloride 0.9 % bolus 1,000 mL (0 mLs Intravenous Stopped 10/13/18 0954)  acetaminophen (TYLENOL) tablet 650 mg (650 mg Oral Given 10/13/18 0744)  ondansetron (ZOFRAN) injection 4 mg (4 mg Intravenous Given  10/13/18 0800)  oseltamivir (TAMIFLU) capsule 75 mg (75 mg Oral Given 10/13/18 1006)     Initial Impression / Assessment and Plan / ED Course  I have reviewed the triage vital signs and the nursing notes.  Pertinent labs & imaging results that were available during my care of the patient were reviewed by me and considered in my medical decision making (see chart for details).        Patient here for evaluation of body aches, fever, headache, cough and nausea. She is non-toxic appearing on evaluation with no respiratory distress. She is influenza a positive.  No evidence of complicating features such as sepsis, pneumonia, renal failure. Discussed with patient and family via interpreter home care for influenza. Discussed oral fluid hydration, fever control and symptom management. Recommend continuing her Tamiflu as prescribed. Discussed close outpatient follow-up as well as return precautions.  Final Clinical Impressions(s) / ED Diagnoses   Final diagnoses:  Influenza A    ED Discharge Orders         Ordered    benzonatate (TESSALON) 100 MG capsule  3 times daily PRN     10/13/18 0943    acetaminophen (TYLENOL) 325 MG tablet  Every 6 hours PRN     10/13/18 0943           Tilden Fossa, MD 10/13/18 1529

## 2018-10-13 NOTE — ED Triage Notes (Signed)
Per son: The last 2 days the pt has been "feeling cold easily and shivering. She has body aches". Pt running temp of 101.5 in triage. No medications PTA.

## 2018-10-13 NOTE — ED Notes (Signed)
This RN called x-ray to notify them pt was ready for transport.

## 2018-10-13 NOTE — ED Notes (Signed)
Patient transported to X-ray 

## 2019-03-15 ENCOUNTER — Emergency Department (HOSPITAL_COMMUNITY)
Admission: EM | Admit: 2019-03-15 | Discharge: 2019-03-15 | Disposition: A | Discharge status: Home / Self Care | Payer: 59 | Attending: Emergency Medicine | Admitting: Emergency Medicine

## 2019-03-15 ENCOUNTER — Emergency Department (HOSPITAL_COMMUNITY): Payer: 59

## 2019-03-15 ENCOUNTER — Encounter (HOSPITAL_COMMUNITY): Payer: Self-pay | Admitting: Emergency Medicine

## 2019-03-15 DIAGNOSIS — U071 COVID-19: Secondary | ICD-10-CM

## 2019-03-15 DIAGNOSIS — R000 Tachycardia, unspecified: Secondary | ICD-10-CM | POA: Insufficient documentation

## 2019-03-15 DIAGNOSIS — R51 Headache: Secondary | ICD-10-CM | POA: Diagnosis present

## 2019-03-15 LAB — CBC WITH DIFFERENTIAL/PLATELET
Abs Immature Granulocytes: 0.02 10*3/uL (ref 0.00–0.07)
Basophils Absolute: 0 10*3/uL (ref 0.0–0.1)
Basophils Relative: 0 %
Eosinophils Absolute: 0 10*3/uL (ref 0.0–0.5)
Eosinophils Relative: 0 %
HCT: 44.4 % (ref 36.0–46.0)
Hemoglobin: 14.1 g/dL (ref 12.0–15.0)
Immature Granulocytes: 0 %
Lymphocytes Relative: 17 %
Lymphs Abs: 1.1 10*3/uL (ref 0.7–4.0)
MCH: 24.6 pg — ABNORMAL LOW (ref 26.0–34.0)
MCHC: 31.8 g/dL (ref 30.0–36.0)
MCV: 77.4 fL — ABNORMAL LOW (ref 80.0–100.0)
Monocytes Absolute: 0.6 10*3/uL (ref 0.1–1.0)
Monocytes Relative: 9 %
Neutro Abs: 4.8 10*3/uL (ref 1.7–7.7)
Neutrophils Relative %: 74 %
Platelets: 298 10*3/uL (ref 150–400)
RBC: 5.74 MIL/uL — ABNORMAL HIGH (ref 3.87–5.11)
RDW: 14 % (ref 11.5–15.5)
WBC: 6.5 10*3/uL (ref 4.0–10.5)
nRBC: 0 % (ref 0.0–0.2)

## 2019-03-15 LAB — COMPREHENSIVE METABOLIC PANEL
ALT: 20 U/L (ref 0–44)
AST: 32 U/L (ref 15–41)
Albumin: 4.4 g/dL (ref 3.5–5.0)
Alkaline Phosphatase: 71 U/L (ref 38–126)
Anion gap: 13 (ref 5–15)
BUN: 6 mg/dL — ABNORMAL LOW (ref 8–23)
CO2: 22 mmol/L (ref 22–32)
Calcium: 9.5 mg/dL (ref 8.9–10.3)
Chloride: 105 mmol/L (ref 98–111)
Creatinine, Ser: 0.71 mg/dL (ref 0.44–1.00)
GFR calc Af Amer: >60 mL/min (ref >60)
GFR calc non Af Amer: >60 mL/min (ref >60)
Glucose, Bld: 127 mg/dL — ABNORMAL HIGH (ref 70–99)
Potassium: 3.4 mmol/L — ABNORMAL LOW (ref 3.5–5.1)
Sodium: 140 mmol/L (ref 135–145)
Total Bilirubin: 1.2 mg/dL (ref 0.3–1.2)
Total Protein: 8.5 g/dL — ABNORMAL HIGH (ref 6.5–8.1)

## 2019-03-15 LAB — SARS CORONAVIRUS 2 BY RT PCR (HOSPITAL ORDER, PERFORMED IN ~~LOC~~ HOSPITAL LAB): SARS Coronavirus 2: POSITIVE — AB

## 2019-03-15 LAB — LIPASE, BLOOD: Lipase: 20 U/L (ref 11–51)

## 2019-03-15 MED ORDER — ONDANSETRON 4 MG PO TBDP: 4.0000 mg | ORAL_TABLET | Freq: Three times a day (TID) | ORAL | 0 refills | Status: DC | PRN

## 2019-03-15 MED ORDER — METOCLOPRAMIDE HCL 5 MG/ML IJ SOLN
10.0000 mg | Freq: Once | INTRAMUSCULAR | Status: AC
  Administered 2019-03-15: 10 mg via INTRAVENOUS
  Filled 2019-03-15: qty 2

## 2019-03-15 MED ORDER — ONDANSETRON 4 MG PO TBDP
4.0000 mg | ORAL_TABLET | Freq: Once | ORAL | Status: AC
  Administered 2019-03-15: 10:00:00 4 mg via ORAL
  Filled 2019-03-15: qty 1

## 2019-03-15 MED ORDER — SODIUM CHLORIDE 0.9 % IV BOLUS
1000.0000 mL | Freq: Once | INTRAVENOUS | Status: AC
  Administered 2019-03-15: 1000 mL via INTRAVENOUS

## 2019-03-15 MED ORDER — DIPHENHYDRAMINE HCL 50 MG/ML IJ SOLN
25.0000 mg | Freq: Once | INTRAMUSCULAR | Status: AC
  Administered 2019-03-15: 12:00:00 25 mg via INTRAVENOUS
  Filled 2019-03-15: qty 1

## 2019-03-15 MED ORDER — DEXAMETHASONE SODIUM PHOSPHATE 10 MG/ML IJ SOLN
10.0000 mg | Freq: Once | INTRAMUSCULAR | Status: AC
  Administered 2019-03-15: 12:00:00 10 mg via INTRAVENOUS
  Filled 2019-03-15: qty 1

## 2019-03-15 MED ORDER — ACETAMINOPHEN 325 MG PO TABS
650.0000 mg | ORAL_TABLET | Freq: Once | ORAL | Status: AC
  Administered 2019-03-15: 650 mg via ORAL
  Filled 2019-03-15: qty 2

## 2019-03-15 NOTE — ED Provider Notes (Signed)
[facility] EMERGENCY DEPARTMENT Provider Note   CSN: [id number] Arrival date & time: 03/15/19  0709    History   Chief Complaint Headache, abdominal pain  HPI Debbie Proctor is a 63 y.o. female past medical history of GERD presents emergency department today with chief complaint of headache and abdominal pain. This is been going on for 2 days.  Patient states her headache has gradually worsened since onset.  She is also reporting bilateral arm numbness that has since resolved.  She rates the pain from her headache 7 out of 10 in severity.  She states it feels like headache she has had in the past.  Pt has generalized abdominal pain with associated nausea and emesis.  She estimates 4 episodes of nonbloody nonbilious emesis.  She admits to chills.  She  is unsure if she has had a fever, she does not have a thermometer at home.  Did not take any medications for symptoms prior to arrival.  She denies any sick contacts.  Also denies chest pain, shortness of breath, cough, urinary symptoms, diarrhea, visual changes, neck pain, rash, weakness.  She is not anticoagulated.   Due to language barrier, a phone interpreter was present during the history-taking and subsequent discussion (and for part of the physical exam) with this patient.   Past Medical History:  Diagnosis Date  . GERD (gastroesophageal reflux disease)     Patient Active Problem List   Diagnosis Date Noted  . Healthcare maintenance 07/29/2016  . Overweight 07/29/2016  . Insertion of Nexplanon 09/11/2012  . Carpal tunnel syndrome, left 08/25/2012  . GERD (gastroesophageal reflux disease) 08/25/2012    Past Surgical History:  Procedure Laterality Date  . CESAREAN SECTION  2001   baby was breeched     OB History   No obstetric history on file.      Home Medications    Prior to Admission medications   Medication Sig Start Date End Date Taking? Authorizing Provider  acetaminophen (TYLENOL) 325 MG tablet  Take 2 tablets (650 mg total) by mouth every 6 (six) hours as needed for moderate pain or fever. 10/13/18   Quintella Reichert, MD  amoxicillin-clavulanate (AUGMENTIN) 875-125 MG per tablet Take 1 tablet by mouth 2 (two) times daily. 08/25/13   Angelica Ran, MD  benzonatate (TESSALON) 100 MG capsule Take 1 capsule (100 mg total) by mouth 3 (three) times daily as needed for cough. 10/13/18   Quintella Reichert, MD  guaifenesin (ROBITUSSIN) 100 MG/5ML syrup Take 200 mg by mouth daily as needed for cough (and cold).    [provider]  ondansetron (ZOFRAN ODT) 4 MG disintegrating tablet Take 1 tablet (4 mg total) by mouth every 8 (eight) hours as needed for nausea or vomiting. 03/15/19   Albrizze, Harley Hallmark, PA-C    Family History History reviewed. No pertinent family history.  Social History Social History   Tobacco Use  . Smoking status: Never Smoker  . Smokeless tobacco: Never Used  Substance Use Topics  . Alcohol use: No  . Drug use: Not on file     Allergies   Patient has no known allergies.   Review of Systems Review of Systems  Constitutional: Positive for chills. Negative for fever.  HENT: Negative for congestion, ear discharge, ear pain, sinus pressure, sinus pain and sore throat.   Eyes: Negative for pain and redness.  Respiratory: Negative for cough and shortness of breath.   Cardiovascular: Negative for chest pain.  Gastrointestinal: Positive for abdominal  pain, nausea and vomiting. Negative for constipation and diarrhea.  Genitourinary: Negative for dysuria and hematuria.  Musculoskeletal: Negative for back pain and neck pain.  Skin: Negative for wound.  Neurological: Positive for headaches. Negative for weakness and numbness.     Physical Exam Updated Vital Signs BP (!) 148/90 (BP Location: Right Arm)   Pulse (!) 108   Temp 98.3 F (36.8 C) (Oral)   Resp 20   SpO2 98%   Physical Exam Vitals signs and nursing note reviewed.  Constitutional:       General: She is not in acute distress.    Appearance: She is not ill-appearing.  HENT:     Head: Normocephalic and atraumatic.     Comments: No sinus or temporal tenderness.    Right Ear: Tympanic membrane and external ear normal.     Left Ear: Tympanic membrane and external ear normal.     Nose: Nose normal.     Mouth/Throat:     Mouth: Mucous membranes are dry.     Pharynx: Oropharynx is clear.  Eyes:     General: No scleral icterus.       Right eye: No discharge.        Left eye: No discharge.     Extraocular Movements: Extraocular movements intact.     Conjunctiva/sclera: Conjunctivae normal.     Pupils: Pupils are equal, round, and reactive to light.  Neck:     Musculoskeletal: Normal range of motion. No neck rigidity or muscular tenderness.     Vascular: No JVD.  Cardiovascular:     Rate and Rhythm: Regular rhythm. Tachycardia present.     Pulses: Normal pulses.          Radial pulses are 2+ on the right side and 2+ on the left side.     Heart sounds: Normal heart sounds.  Pulmonary:     Comments: Lungs clear to auscultation in all fields. Symmetric chest rise. No wheezing, rales, or rhonchi. Abdominal:     Comments: Abdomen is soft, non-distended.  Generalized abdominal tenderness to palpation.  No rigidity, no guarding. No peritoneal signs.  Musculoskeletal: Normal range of motion.  Lymphadenopathy:     Cervical: No cervical adenopathy.  Skin:    General: Skin is warm and dry.     Capillary Refill: Capillary refill takes less than 2 seconds.  Neurological:     Mental Status: She is oriented to person, place, and time.     GCS: GCS eye subscore is 4. GCS verbal subscore is 5. GCS motor subscore is 6.     Comments: Mental Status:  Alert, oriented, thought content appropriate, able to give a coherent history. Speech fluent without evidence of aphasia. Able to follow 2 step commands without difficulty.  Cranial Nerves:  II:  Peripheral visual fields grossly normal,  pupils equal, round, reactive to light III,IV, VI: ptosis not present, extra-ocular motions intact bilaterally  V,VII: smile symmetric, facial light touch sensation equal VIII: hearing grossly normal to voice  X: uvula elevates symmetrically  XI: bilateral shoulder shrug symmetric and strong XII: midline tongue extension without fassiculations Motor:  Normal tone. 5/5 in upper and lower extremities bilaterally including strong and equal grip strength and dorsiflexion/plantar flexion Sensory: Pinprick and light touch normal in all extremities.  Deep Tendon Reflexes: 2+ and symmetric in the biceps and patella Cerebellar: normal finger-to-nose with bilateral upper extremities Gait: normal gait and balance     Psychiatric:        Behavior:  Behavior normal.      ED Treatments / Results  Labs (all labs ordered are listed, but only abnormal results are displayed) Labs Reviewed  SARS CORONAVIRUS 2 (HOSPITAL ORDER, PERFORMED IN [facility]) - Abnormal; Notable for the following components:      Result Value   SARS Coronavirus 2 POSITIVE (*)    All other components within normal limits  COMPREHENSIVE METABOLIC PANEL - Abnormal; Notable for the following components:   Potassium 3.4 (*)    Glucose, Bld 127 (*)    BUN 6 (*)    Total Protein 8.5 (*)    All other components within normal limits  CBC WITH DIFFERENTIAL/PLATELET - Abnormal; Notable for the following components:   RBC 5.74 (*)    MCV 77.4 (*)    MCH 24.6 (*)    All other components within normal limits  LIPASE, BLOOD  URINALYSIS, ROUTINE W REFLEX MICROSCOPIC    EKG None  Radiology Dg Chest Portable 1 View  Result Date: 03/15/2019 CLINICAL DATA:  Body aches.  Cough. EXAM: PORTABLE CHEST 1 VIEW COMPARISON:  Two-view chest x-ray 10/13/2018 FINDINGS: The heart size is exaggerate by low lung volumes. There is no edema or effusion. No focal airspace disease is present. Minimal basilar atelectasis is noted.  IMPRESSION: 1. Low lung volumes. 2. No acute cardiopulmonary disease. Electronically Signed   By: Marin Robertshristopher  Mattern M.D.   On: 03/15/2019 09:29    Procedures Procedures (including critical care time)  Medications Ordered in ED Medications  ondansetron (ZOFRAN-ODT) disintegrating tablet 4 mg (4 mg Oral Given 03/15/19 0939)  sodium chloride 0.9 % bolus 1,000 mL (0 mLs Intravenous Stopped 03/15/19 1111)  acetaminophen (TYLENOL) tablet 650 mg (650 mg Oral Given 03/15/19 1132)  metoCLOPramide (REGLAN) injection 10 mg (10 mg Intravenous Given 03/15/19 1136)  dexamethasone (DECADRON) injection 10 mg (10 mg Intravenous Given 03/15/19 1136)  diphenhydrAMINE (BENADRYL) injection 25 mg (25 mg Intravenous Given 03/15/19 1134)     Initial Impression / Assessment and Plan / ED Course  I have reviewed the triage vital signs and the nursing notes.  Pertinent labs & imaging results that were available during my care of the patient were reviewed by me and considered in my medical decision making (see chart for details).   63 year old female presents with headache and vomiting.  She is ill-appearing, in no acute distress, Neuro exam without focal deficit.  She is reporting bilateral arm numbness, on exam do not appreciate any deficit, low suspicion for neurologic etiology.  No meningeal signs, non focal neuro exam, doubt meningitis.  Generalized abdominal tenderness on exam, no peritoneal signs.  CBC and CMP overall unremarkable.  Lipase is within normal range.  Patient is positive for coronavirus. Pt HA treated and improved while in ED.  Presentation is like pts typical HA and non [facility], Meningitis, or temporal arteritis. On reassessment tachycardia and temperature have improved. She admits to feeling better after IV fluids. Discussed results with pt's daughter on the phone. She agrees to plan for discharge home with symptomatic treatment.  Patient is hemodynamically stable, in NAD, and able  to ambulate in the ED. Evaluation does not show pathology that would require ongoing emergent intervention or inpatient treatment. I explained the diagnosis to the patient.  Pain has been managed and has no complaints prior to discharge. Patient is comfortable with above plan and is stable for discharge at this time. All questions were answered prior to disposition. Strict return precautions for returning  to the ED were discussed. Encouraged follow up with PCP.   Serafina MitchellYon Proctor was evaluated in Emergency Department on 03/15/2019 for the symptoms described in the history of present illness. She was evaluated in the context of the global COVID-19 pandemic, which necessitated consideration that the patient might be at risk for infection with the SARS-CoV-2 virus that causes COVID-19. Institutional protocols and algorithms that pertain to the evaluation of patients at risk for COVID-19 are in a state of rapid change based on information released by regulatory bodies including the CDC and federal and state organizations. These policies and algorithms were followed during the patient's care in the ED.   Final Clinical Impressions(s) / ED Diagnoses   Final diagnoses:  COVID-19    ED Discharge Orders         Ordered    ondansetron (ZOFRAN ODT) 4 MG disintegrating tablet  Every 8 hours PRN     03/15/19 1257           Albrizze, Caroleen HammanKaitlyn E, PA-C 03/15/19 1339    Rolan BuccoBelfi, Melanie, MD 03/15/19 1342

## 2019-03-15 NOTE — ED Notes (Signed)
[phone] daughter

## 2019-03-15 NOTE — Discharge Instructions (Addendum)
You have been seen today for headache and arm pain. Please read and follow all provided instructions. Return to the emergency room for worsening condition or new concerning symptoms.    1. Medications:  Please take Tylenol as needed for headaches and body aches. -Prescriptions sent to your pharmacy for zofran. This is for nausea, please take as needed. Continue usual home medications  Take medications as prescribed. Please review all of the medicines and only take them if you do not have an allergy to them.   You tested positive for coronavirus today. You will need to self quarantine.  2. Treatment: rest, drink plenty of fluids  3. Follow Up: Please follow up with your primary doctor in 2-5 days for discussion of your diagnoses and further evaluation after today's visit; Call today to arrange your follow up.  If you do not have a primary care doctor use the resource guide provided to find one;   It is also a possibility that you have an allergic reaction to any of the medicines that you have been prescribed - Everybody reacts differently to medications and while MOST people have no trouble with most medicines, you may have a reaction such as nausea, vomiting, rash, swelling, shortness of breath. If this is the case, please stop taking the medicine immediately and contact your physician.  ?

## 2019-03-15 NOTE — ED Triage Notes (Signed)
Pt here with c/o not feeling well with h/a and abd pain times 2 days , no fever but husband said she is also been c/o sob

## 2019-04-01 ENCOUNTER — Encounter (HOSPITAL_COMMUNITY): Payer: Self-pay | Admitting: Emergency Medicine

## 2019-04-01 ENCOUNTER — Other Ambulatory Visit: Payer: Self-pay

## 2019-04-01 ENCOUNTER — Ambulatory Visit (HOSPITAL_COMMUNITY)
Admission: EM | Admit: 2019-04-01 | Discharge: 2019-04-01 | Disposition: A | Discharge status: Home / Self Care | Payer: 59

## 2019-04-01 DIAGNOSIS — U071 COVID-19: Secondary | ICD-10-CM

## 2019-04-01 DIAGNOSIS — Z711 Person with feared health complaint in whom no diagnosis is made: Secondary | ICD-10-CM

## 2019-04-01 NOTE — ED Provider Notes (Signed)
[place]    CSN: [id number] Arrival date & time: 04/01/19  1003     History   Chief Complaint Chief Complaint  Patient presents with  . Follow-up    HPI Debbie Proctor is a 63 y.o. female.   Patient presents with request for return to work note.  She was diagnosed with COVID on 03/15/2019.  She is asymptomatic.  She denies fever, chills, cough, shortness of breath, vomiting, diarrhea, or other symptoms. History limited by no availability of a [place] interpreter through interpreter services.  Husband and daughter assisted with history.  The history is provided by the patient, the spouse and a relative. No language interpreter was used.    Past Medical History:  Diagnosis Date  . GERD (gastroesophageal reflux disease)     Patient Active Problem List   Diagnosis Date Noted  . Healthcare maintenance 07/29/2016  . Overweight 07/29/2016  . Insertion of Nexplanon 09/11/2012  . Carpal tunnel syndrome, left 08/25/2012  . GERD (gastroesophageal reflux disease) 08/25/2012    Past Surgical History:  Procedure Laterality Date  . CESAREAN SECTION  2001   baby was breeched    OB History   No obstetric history on file.      Home Medications    Prior to Admission medications   Medication Sig Start Date End Date Taking? Authorizing Provider  acetaminophen (TYLENOL) 325 MG tablet Take 2 tablets (650 mg total) by mouth every 6 (six) hours as needed for moderate pain or fever. 10/13/18   Quintella Reichert, MD  amoxicillin-clavulanate (AUGMENTIN) 875-125 MG per tablet Take 1 tablet by mouth 2 (two) times daily. Patient not taking: Reported on 04/01/2019 08/25/13   Angelica Ran, MD  benzonatate (TESSALON) 100 MG capsule Take 1 capsule (100 mg total) by mouth 3 (three) times daily as needed for cough. 10/13/18   Quintella Reichert, MD  guaifenesin (ROBITUSSIN) 100 MG/5ML syrup Take 200 mg by mouth daily as needed for cough (and cold).    [provider]  ondansetron  (ZOFRAN ODT) 4 MG disintegrating tablet Take 1 tablet (4 mg total) by mouth every 8 (eight) hours as needed for nausea or vomiting. 03/15/19   Albrizze, Harley Hallmark, PA-C    Family History No family history on file.  Social History Social History   Tobacco Use  . Smoking status: Never Smoker  . Smokeless tobacco: Never Used  Substance Use Topics  . Alcohol use: No  . Drug use: Not on file     Allergies   Patient has no known allergies.   Review of Systems Review of Systems  Constitutional: Negative for chills and fever.  HENT: Negative for congestion, ear pain, rhinorrhea and sore throat.   Eyes: Negative for pain and visual disturbance.  Respiratory: Negative for cough and shortness of breath.   Cardiovascular: Negative for chest pain and palpitations.  Gastrointestinal: Negative for abdominal pain, diarrhea and vomiting.  Genitourinary: Negative for dysuria and hematuria.  Musculoskeletal: Negative for arthralgias and back pain.  Skin: Negative for color change and rash.  Neurological: Negative for seizures and syncope.  All other systems reviewed and are negative.    Physical Exam Triage Vital Signs ED Triage Vitals [04/01/19 1023]  Enc Vitals Group     BP 134/79     Pulse Rate 87     Resp 16     Temp 98.5 F (36.9 C)     Temp src      SpO2 100 %  Weight      Height      Head Circumference      Peak Flow      Pain Score 0     Pain Loc      Pain Edu?      Excl. in GC?    No data found.  Updated Vital Signs BP 134/79   Pulse 87   Temp 98.5 F (36.9 C)   Resp 16   SpO2 100%   Visual Acuity Right Eye Distance:   Left Eye Distance:   Bilateral Distance:    Right Eye Near:   Left Eye Near:    Bilateral Near:     Physical Exam Vitals signs and nursing note reviewed.  Constitutional:      General: She is not in acute distress.    Appearance: She is well-developed.  HENT:     Head: Normocephalic and atraumatic.     Right Ear: Tympanic  membrane normal.     Left Ear: Tympanic membrane normal.     Nose: Nose normal.     Mouth/Throat:     Mouth: Mucous membranes are moist.     Pharynx: Oropharynx is clear.  Eyes:     Conjunctiva/sclera: Conjunctivae normal.  Neck:     Musculoskeletal: Neck supple.  Cardiovascular:     Rate and Rhythm: Normal rate and regular rhythm.     Heart sounds: No murmur.  Pulmonary:     Effort: Pulmonary effort is normal. No respiratory distress.     Breath sounds: Normal breath sounds.  Abdominal:     Palpations: Abdomen is soft.     Tenderness: There is no abdominal tenderness. There is no guarding or rebound.  Skin:    General: Skin is warm and dry.     Findings: No rash.  Neurological:     General: No focal deficit present.     Mental Status: She is alert.     Sensory: No sensory deficit.     Motor: No weakness.     Gait: Gait normal.     Deep Tendon Reflexes: Reflexes normal.      UC Treatments / Results  Labs (all labs ordered are listed, but only abnormal results are displayed) Labs Reviewed - No data to display  EKG   Radiology No results found.  Procedures Procedures (including critical care time)  Medications Ordered in UC Medications - No data to display  Initial Impression / Assessment and Plan / UC Course  I have reviewed the triage vital signs and the nursing notes.  Pertinent labs & imaging results that were available during my care of the patient were reviewed by me and considered in my medical decision making (see chart for details).    Worried well.  COVID-19, resolved.  Discussed with patient and her family that since she had been asymptomatic and without fevers for more than 14 days, she can return to work.  Discussed that we are not performing repeat COVID test unless she is symptomatic.  Patient, husband, daughter agree with this plan of care.  Discussed that she should return here or go to the emergency department if she develops fever, cough,  shortness of breath, diarrhea, or other symptoms.     Final Clinical Impressions(s) / UC Diagnoses   Final diagnoses:  Worried well  COVID-19     Discharge Instructions     You do not have any symptoms today in your COVID test positive was more than 14 days ago.  ED Prescriptions    None     Controlled Substance Prescriptions [place] Controlled Substance Registry consulted? Not Applicable   Mickie Bailate, Katja Blue H, NP 04/01/19 1058

## 2019-04-01 NOTE — ED Triage Notes (Signed)
Pt was positive for covid two weeks ago. Here for follow up.. Pt denies symptoms.

## 2019-04-01 NOTE — Discharge Instructions (Signed)
You do not have any symptoms today in your COVID test positive was more than 14 days ago.

## 2020-03-03 IMAGING — DX PORTABLE CHEST - 1 VIEW
1 series · 1 of 1 positions shown · non-contrast
Comparison: Two-view chest x-ray 10/13/2018

CLINICAL DATA: Body aches.  Cough.

EXAM:
PORTABLE CHEST 1 VIEW

[chest ap]
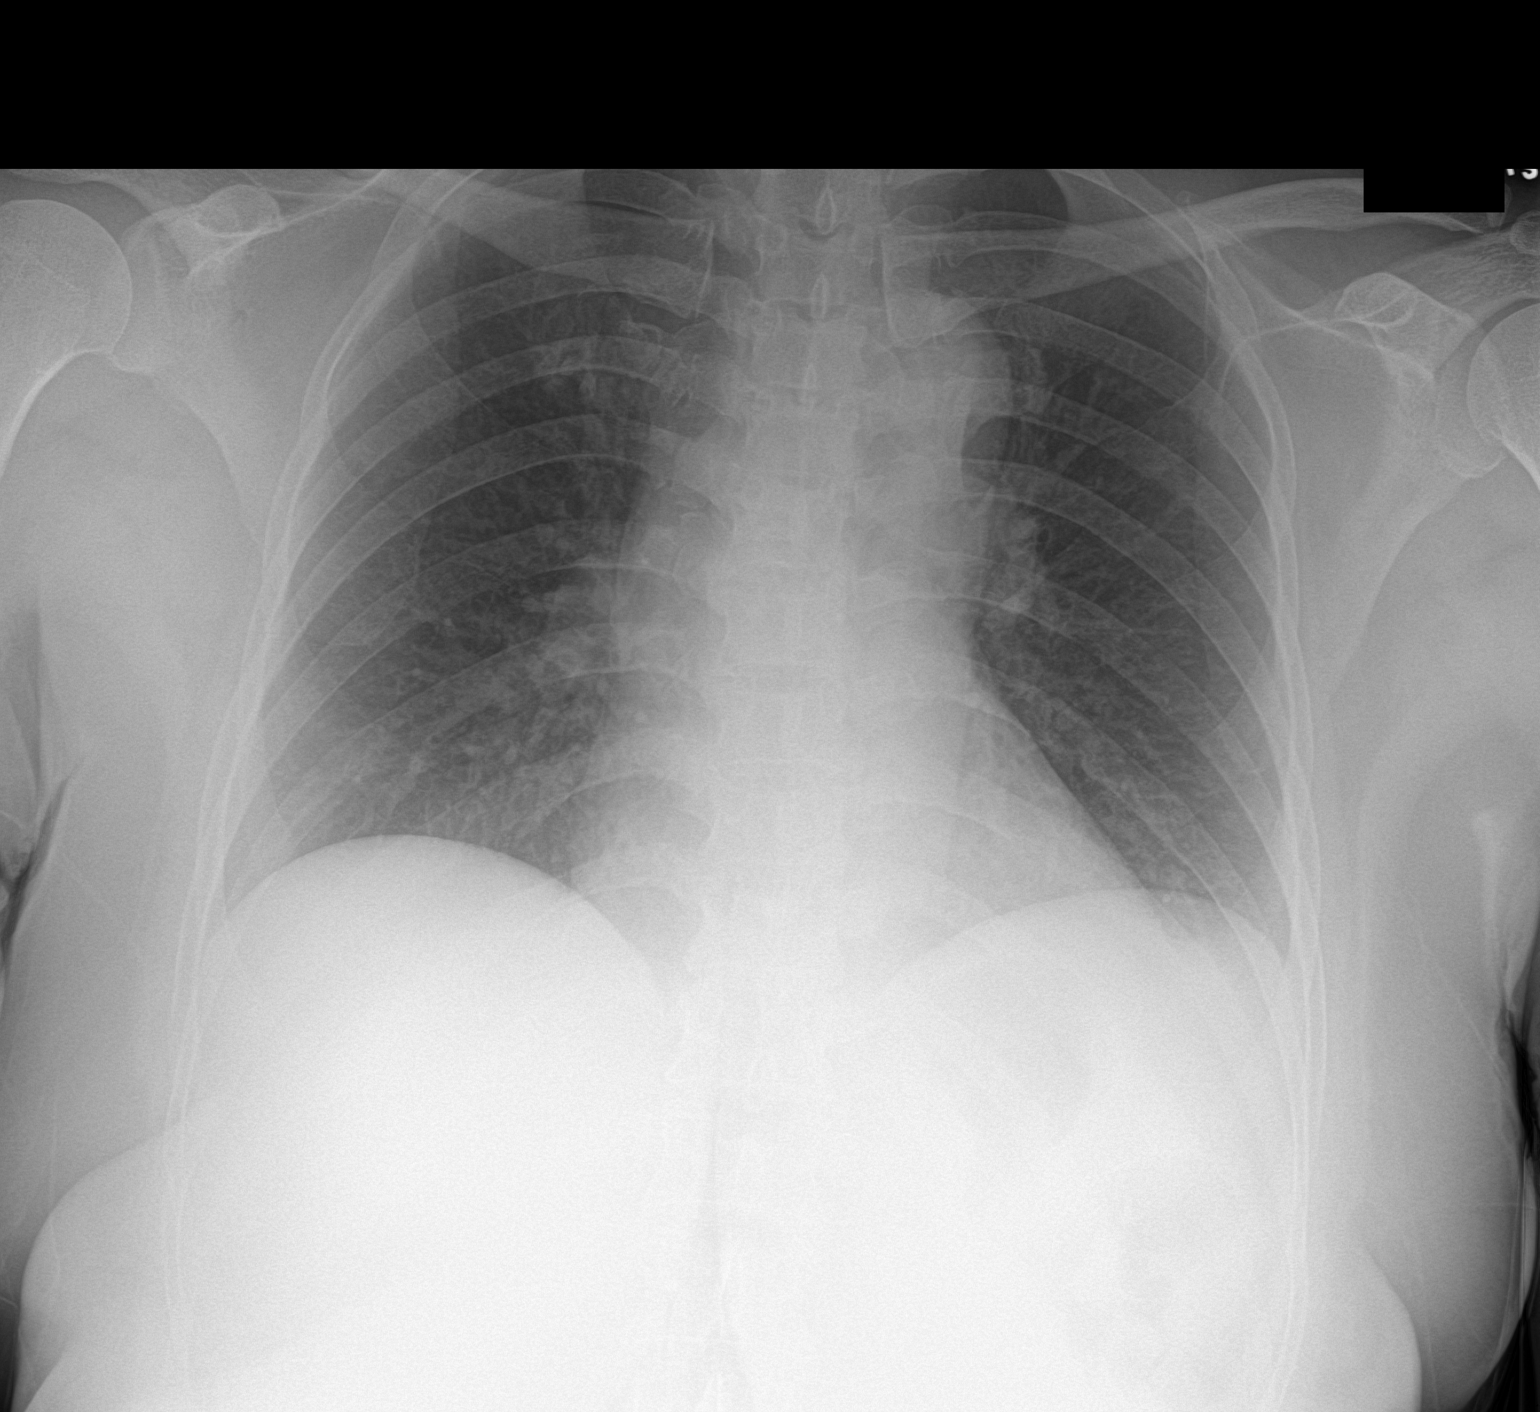

[1 of 1 positions shown; findings below may reference images not displayed]

FINDINGS: The heart size is exaggerate by low lung volumes. There is no edema
or effusion. No focal airspace disease is present. Minimal basilar
atelectasis is noted.
IMPRESSION: 1. Low lung volumes.
2. No acute cardiopulmonary disease.

## 2021-12-20 ENCOUNTER — Emergency Department (HOSPITAL_COMMUNITY): Payer: 59

## 2021-12-20 ENCOUNTER — Encounter (HOSPITAL_COMMUNITY): Payer: Self-pay

## 2021-12-20 ENCOUNTER — Other Ambulatory Visit: Payer: Self-pay

## 2021-12-20 ENCOUNTER — Emergency Department (HOSPITAL_COMMUNITY)
Admission: EM | Admit: 2021-12-20 | Discharge: 2021-12-20 | Disposition: A | Discharge status: Home / Self Care | Payer: 59 | Attending: Emergency Medicine | Admitting: Emergency Medicine

## 2021-12-20 DIAGNOSIS — B349 Viral infection, unspecified: Secondary | ICD-10-CM | POA: Diagnosis not present

## 2021-12-20 DIAGNOSIS — Z20822 Contact with and (suspected) exposure to covid-19: Secondary | ICD-10-CM | POA: Insufficient documentation

## 2021-12-20 DIAGNOSIS — R079 Chest pain, unspecified: Secondary | ICD-10-CM | POA: Diagnosis present

## 2021-12-20 LAB — BASIC METABOLIC PANEL
Anion gap: 10 (ref 5–15)
BUN: <5 mg/dL — ABNORMAL LOW (ref 8–23)
CO2: 24 mmol/L (ref 22–32)
Calcium: 9.5 mg/dL (ref 8.9–10.3)
Chloride: 101 mmol/L (ref 98–111)
Creatinine, Ser: 0.67 mg/dL (ref 0.44–1.00)
GFR, Estimated: >60 mL/min (ref >60)
Glucose, Bld: 131 mg/dL — ABNORMAL HIGH (ref 70–99)
Potassium: 3.7 mmol/L (ref 3.5–5.1)
Sodium: 135 mmol/L (ref 135–145)

## 2021-12-20 LAB — CBC
HCT: 46.4 % — ABNORMAL HIGH (ref 36.0–46.0)
Hemoglobin: 14.6 g/dL (ref 12.0–15.0)
MCH: 24.1 pg — ABNORMAL LOW (ref 26.0–34.0)
MCHC: 31.5 g/dL (ref 30.0–36.0)
MCV: 76.4 fL — ABNORMAL LOW (ref 80.0–100.0)
Platelets: 284 10*3/uL (ref 150–400)
RBC: 6.07 MIL/uL — ABNORMAL HIGH (ref 3.87–5.11)
RDW: 13.3 % (ref 11.5–15.5)
WBC: 6.3 10*3/uL (ref 4.0–10.5)
nRBC: 0 % (ref 0.0–0.2)

## 2021-12-20 LAB — RESP PANEL BY RT-PCR (FLU A&B, COVID) ARPGX2
Influenza A by PCR: NEGATIVE
Influenza B by PCR: NEGATIVE
SARS Coronavirus 2 by RT PCR: NEGATIVE

## 2021-12-20 LAB — TROPONIN I (HIGH SENSITIVITY)
Troponin I (High Sensitivity): 3 ng/L (ref <18)
Troponin I (High Sensitivity): 3 ng/L (ref <18)

## 2021-12-20 MED ORDER — IBUPROFEN 400 MG PO TABS
600.0000 mg | ORAL_TABLET | Freq: Once | ORAL | Status: AC
  Administered 2021-12-20: 600 mg via ORAL

## 2021-12-20 MED ORDER — ONDANSETRON 4 MG PO TBDP
4.0000 mg | ORAL_TABLET | Freq: Once | ORAL | Status: AC
  Administered 2021-12-20: 4 mg via ORAL

## 2021-12-20 MED ORDER — ONDANSETRON 4 MG PO TBDP
ORAL_TABLET | ORAL | Status: AC
  Filled 2021-12-20: qty 1

## 2021-12-20 MED ORDER — FAMOTIDINE 20 MG PO TABS
ORAL_TABLET | ORAL | Status: AC
  Filled 2021-12-20: qty 1

## 2021-12-20 MED ORDER — ACETAMINOPHEN 325 MG PO TABS
650.0000 mg | ORAL_TABLET | Freq: Once | ORAL | Status: AC
  Administered 2021-12-20: 650 mg via ORAL

## 2021-12-20 MED ORDER — IBUPROFEN 400 MG PO TABS
ORAL_TABLET | ORAL | Status: AC
  Filled 2021-12-20: qty 1

## 2021-12-20 MED ORDER — IBUPROFEN 600 MG PO TABS: 600.0000 mg | ORAL_TABLET | Freq: Four times a day (QID) | ORAL | 0 refills | Status: DC | PRN

## 2021-12-20 MED ORDER — ONDANSETRON HCL 4 MG PO TABS: 4.0000 mg | ORAL_TABLET | Freq: Three times a day (TID) | ORAL | 0 refills | Status: DC | PRN

## 2021-12-20 MED ORDER — ACETAMINOPHEN 325 MG PO TABS
650.0000 mg | ORAL_TABLET | Freq: Once | ORAL | Status: AC | PRN
  Administered 2021-12-20: 650 mg via ORAL
  Filled 2021-12-20: qty 2

## 2021-12-20 MED ORDER — FAMOTIDINE 20 MG PO TABS
20.0000 mg | ORAL_TABLET | Freq: Once | ORAL | Status: AC
  Administered 2021-12-20: 20 mg via ORAL

## 2021-12-20 MED ORDER — IBUPROFEN 200 MG PO TABS
ORAL_TABLET | ORAL | Status: AC
  Filled 2021-12-20: qty 1

## 2021-12-20 MED ORDER — ACETAMINOPHEN 325 MG PO TABS
ORAL_TABLET | ORAL | Status: AC
  Filled 2021-12-20: qty 2

## 2021-12-20 MED ORDER — ACETAMINOPHEN 325 MG PO TABS: 650.0000 mg | ORAL_TABLET | Freq: Four times a day (QID) | ORAL | 0 refills | Status: DC | PRN

## 2021-12-20 NOTE — ED Provider Notes (Signed)
?[facility] ?Provider Note ? ? ?CSN: [id number] ?Arrival date & time: 12/20/21  0147 ? ?  ? ?History ? ?Chief Complaint  ?Patient presents with  ? Chest Pain  ? ? ?Debbie MitchellYon Proctor is a 66 y.o. female with no sx PMH presenting with multiple symptoms.  Her daughter Debbie Proctor helps to translate into [place] as there is no available translator ([phone]).  She states the patient has been sick now for 3 days, with fevers, chills, headache, cough, malaise, muscle cramping, diarrhea, and intermittent chest pains, which feels like "burning in my stomach".  No sick contacts in the house.  They have not given any medications at home.  The patient does not have any significant cardiac history. ? ?HPI ? ?  ? ?Home Medications ?Prior to Admission medications   ?Medication Sig Start Date End Date Taking? Authorizing Provider  ?acetaminophen (TYLENOL) 325 MG tablet Take 2 tablets (650 mg total) by mouth every 6 (six) hours as needed for up to 30 doses for headache, moderate pain, fever or mild pain. 12/20/21  Yes Debbie Proctor, Kermit BaloMatthew J, MD  ?ibuprofen (ADVIL) 600 MG tablet Take 1 tablet (600 mg total) by mouth every 6 (six) hours as needed for up to 30 doses for mild pain, moderate pain, fever, headache or cramping. 12/20/21  Yes Debbie Proctor, Kermit BaloMatthew J, MD  ?ondansetron (ZOFRAN) 4 MG tablet Take 1 tablet (4 mg total) by mouth every 8 (eight) hours as needed for up to 15 doses for nausea or vomiting. 12/20/21  Yes Debbie Proctor, Kermit BaloMatthew J, MD  ?acetaminophen (TYLENOL) 325 MG tablet Take 2 tablets (650 mg total) by mouth every 6 (six) hours as needed for moderate pain or fever. 10/13/18   Debbie Proctor, Elizabeth, MD  ?amoxicillin-clavulanate (AUGMENTIN) 875-125 MG per tablet Take 1 tablet by mouth 2 (two) times daily. ?Patient not taking: Reported on 04/01/2019 08/25/13   Debbie BuddyWilliamson, Debbie V, MD  ?benzonatate (TESSALON) 100 MG capsule Take 1 capsule (100 mg total) by mouth 3 (three) times daily as needed for cough. 10/13/18    Debbie Proctor, Elizabeth, MD  ?guaifenesin (ROBITUSSIN) 100 MG/5ML syrup Take 200 mg by mouth daily as needed for cough (and cold).    [provider]  ?ondansetron (ZOFRAN ODT) 4 MG disintegrating tablet Take 1 tablet (4 mg total) by mouth every 8 (eight) hours as needed for nausea or vomiting. 03/15/19   Debbie AceWalisiewicz, Debbie E, PA-C  ?   ? ?Allergies    ?Patient has no known allergies.   ? ?Review of Systems   ?Review of Systems ? ?Physical Exam ?Updated Vital Signs ?BP 133/86   Pulse 81   Temp 98.1 ?F (36.7 ?C) (Oral)   Resp (!) 23   Ht 5\' 3"  (1.6 m)   Wt 71.3 kg   SpO2 98%   BMI 27.84 kg/m?  ?Physical Exam ?Constitutional:   ?   General: She is not in acute distress. ?   Comments: Appears tired, fatigue  ?HENT:  ?   Head: Normocephalic and atraumatic.  ?Eyes:  ?   Conjunctiva/sclera: Conjunctivae normal.  ?   Pupils: Pupils are equal, round, and reactive to light.  ?Cardiovascular:  ?   Rate and Rhythm: Normal rate and regular rhythm.  ?Pulmonary:  ?   Effort: Pulmonary effort is normal. No respiratory distress.  ?Abdominal:  ?   General: There is no distension.  ?   Tenderness: There is no abdominal tenderness.  ?Skin: ?   General: Skin is warm  and dry.  ?Neurological:  ?   General: No focal deficit present.  ?   Mental Status: She is alert. Mental status is at baseline.  ?Psychiatric:     ?   Mood and Affect: Mood normal.     ?   Behavior: Behavior normal.  ? ? ?ED Results / Procedures / Treatments   ?Labs ?(all labs ordered are listed, but only abnormal results are displayed) ?Labs Reviewed  ?BASIC METABOLIC PANEL - Abnormal; Notable for the following components:  ?    Result Value  ? Glucose, Bld 131 (*)   ? BUN <5 (*)   ? All other components within normal limits  ?CBC - Abnormal; Notable for the following components:  ? RBC 6.07 (*)   ? HCT 46.4 (*)   ? MCV 76.4 (*)   ? MCH 24.1 (*)   ? All other components within normal limits  ?RESP PANEL BY RT-PCR (FLU A&B, COVID) [id number]  ?TROPONIN I (HIGH  SENSITIVITY)  ?TROPONIN I (HIGH SENSITIVITY)  ? ? ? ? ?Radiology ?DG Chest 2 View ? ?Result Date: 12/20/2021 ?CLINICAL DATA:  Chest pain. EXAM: CHEST - 2 VIEW COMPARISON:  Chest x-ray 03/15/2019 FINDINGS: The heart size and mediastinal contours are within normal limits. Both lungs are clear. The visualized skeletal structures are unremarkable. IMPRESSION: No active cardiopulmonary disease. Electronically Signed   By: Debbie Proctor M.D.   On: 12/20/2021 02:48   ? ?Procedures ?Procedures  ? ? ?Medications Ordered in ED ?Medications  ?acetaminophen (TYLENOL) tablet 650 mg (650 mg Oral Given 12/20/21 0348)  ?ibuprofen (ADVIL) tablet 600 mg (600 mg Oral Given 12/20/21 0740)  ?acetaminophen (TYLENOL) tablet 650 mg (650 mg Oral Given 12/20/21 0739)  ?ondansetron (ZOFRAN-ODT) disintegrating tablet 4 mg (4 mg Oral Given 12/20/21 0740)  ?famotidine (PEPCID) tablet 20 mg (20 mg Oral Given 12/20/21 0739)  ? ? ?ED Course/ Medical Decision Making/ A&P ?  ?                        ?Medical Decision Making ?Amount and/or Complexity of Data Reviewed ?Labs: ordered. ?Radiology: ordered. ? ?Risk ?OTC drugs. ?Prescription drug management. ? ? ?This patient presents to the ED with concern for constellation of symptoms. This involves an extensive number of treatment options, and is a complaint that carries with it a high risk of complications and morbidity.  The differential diagnosis includes viral illness including COVID-19 versus bacterial infection including pneumonia versus reflux gastritis versus other ? ?Additional history obtained from patient's daughter by phone ? ? ?I ordered and personally interpreted labs.  The pertinent results include: Troponin unremarkable, no leukocytosis, BMP within normal limits.  COVID test will be sent and pending ? ?I ordered imaging studies including x-ray of the chest ?I independently visualized and interpreted imaging which showed no focal infiltrate or pneumothorax ?I agree with the radiologist  interpretation ? ?The patient was maintained on a cardiac monitor.  I personally viewed and interpreted the cardiac monitored which showed an underlying rhythm of: Sinus rhythm ? ?Per my interpretation the patient's ECG shows sinus rhythm without acute ischemic findings, mild tachycardia ? ?I ordered medication including ibuprofen and Tylenol for viral syndrome.  Zofran and Pepcid for suspected gastritis and nausea. ?I have reviewed the patients home medicines and have made adjustments as needed ? ?Test Considered: I will lower suspicion for acute meningitis with this constellation of symptoms I do not feel that she needed an emergent LP.  Low  suspicion for pulmonary embolism clinically.  No hypoxia. ? ?After the interventions noted above, I reevaluated the patient and found that they have: stayed the same ? ?Social Determinants of Health:Need for translator ? ?Dispostion: ? ?After consideration of the diagnostic results and the patients response to treatment, I feel that the patent would benefit from outpatient follow-up.  We will manage this conservatively with antipyretics, Tylenol and Motrin at home, Zofran as needed.  Advised the patient to stay well-hydrated by drinking water.  A work note will be provided.  We will follow-up on her COVID test later this afternoon but she will be discharged home for now. ? ? ? ? ? ? ? ? ?Final Clinical Impression(s) / ED Diagnoses ?Final diagnoses:  ?Viral illness  ? ? ?Rx / DC Orders ?ED Discharge Orders   ? ?      Ordered  ?  ibuprofen (ADVIL) 600 MG tablet  Every 6 hours PRN       ? 12/20/21 0717  ?  acetaminophen (TYLENOL) 325 MG tablet  Every 6 hours PRN       ? 12/20/21 0717  ?  ondansetron (ZOFRAN) 4 MG tablet  Every 8 hours PRN       ? 12/20/21 0717  ? ?  ?  ? ?  ? ? ?  ?Terald Sleeper, MD ?12/20/21 1256 ? ?

## 2021-12-20 NOTE — ED Triage Notes (Signed)
Pt began having chills fever generalized weaknes since Thursday night. Pt also c/o chest pain central and does not radiate. Pt with SOB as well. Chest pain has gotten worse. Pt unable to describe chest pain ?

## 2022-12-09 IMAGING — CR DG CHEST 2V
2 series · 2 of 2 positions shown · non-contrast
Comparison: Chest x-ray 03/15/2019

CLINICAL DATA: Chest pain.

EXAM:
CHEST - 2 VIEW

[chest pa]
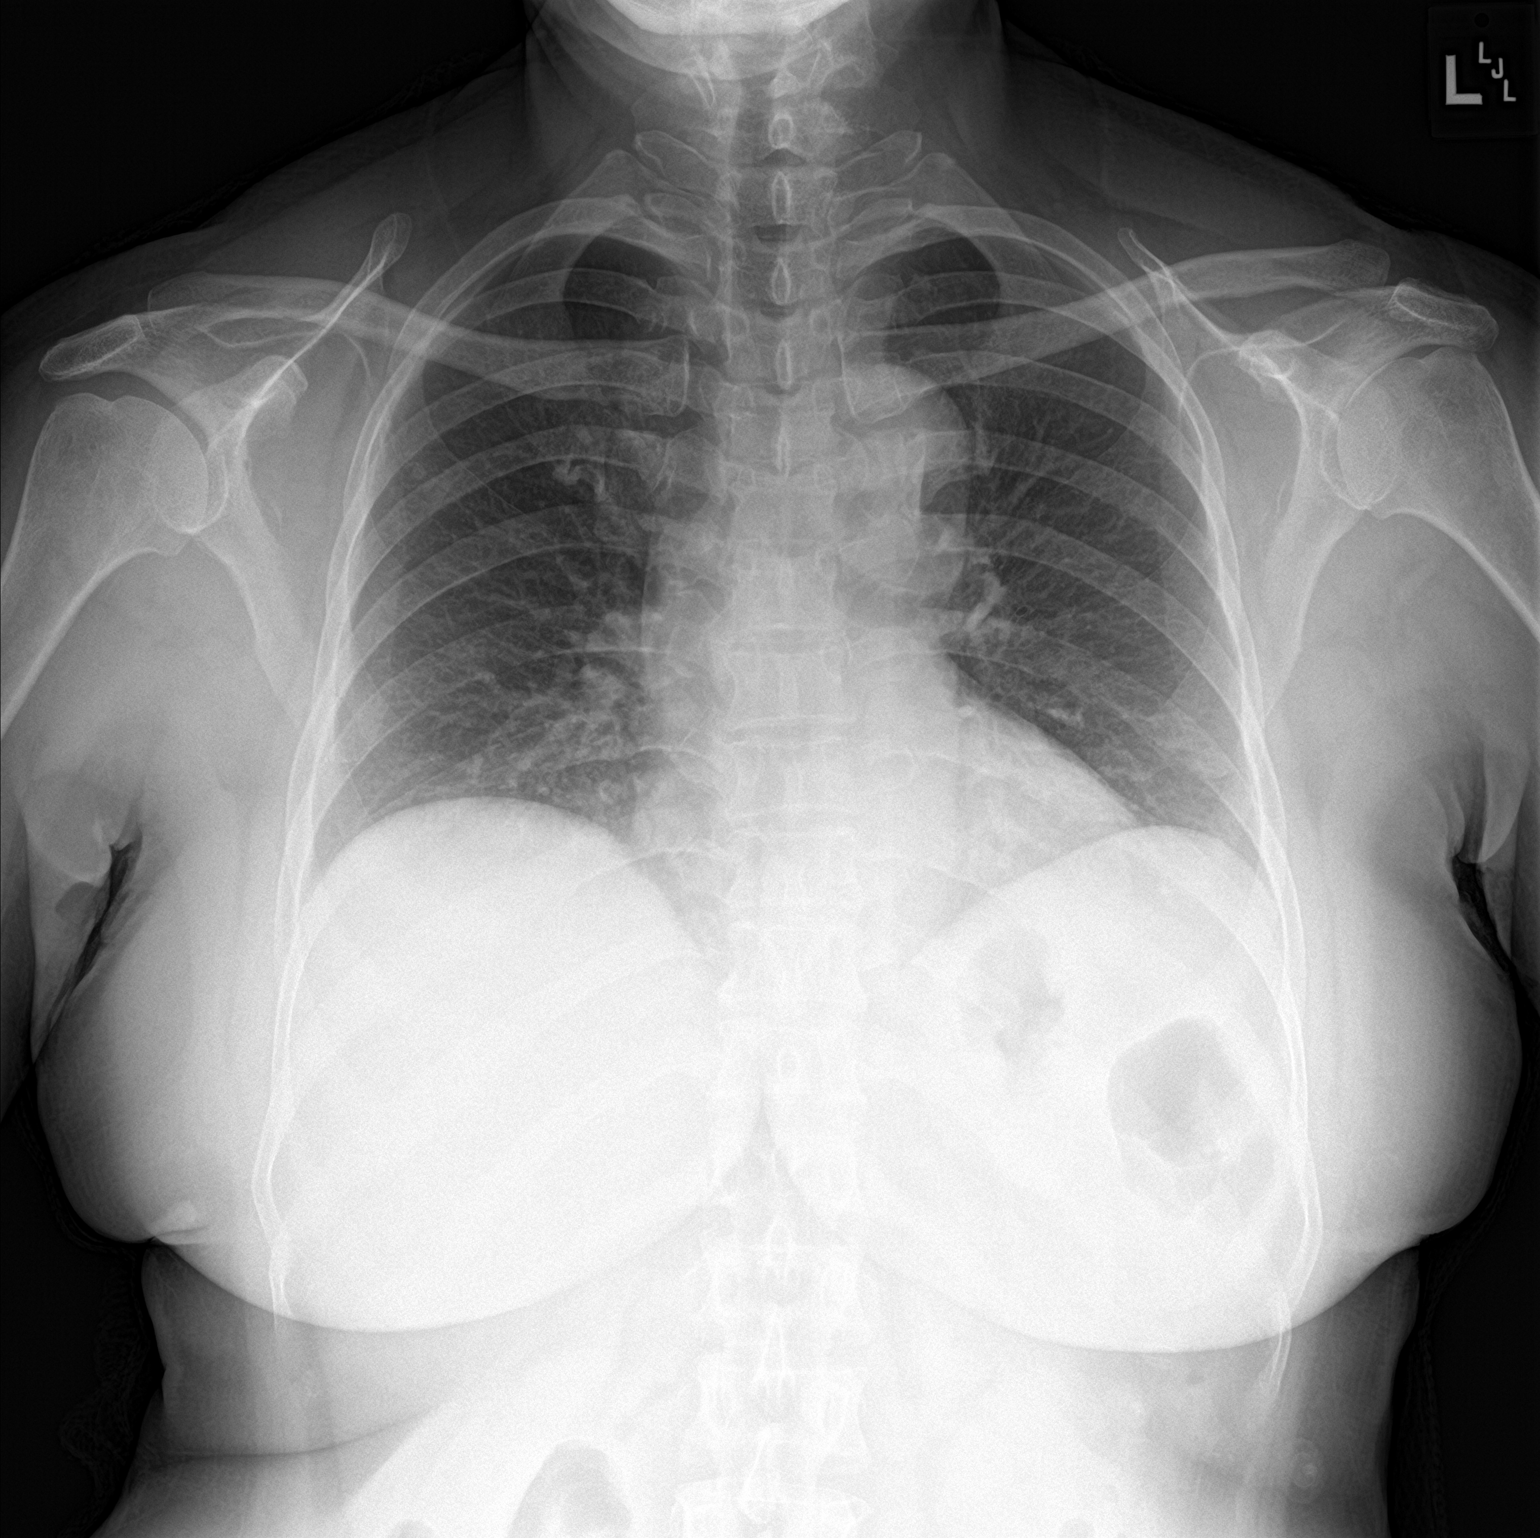

[chest lat]
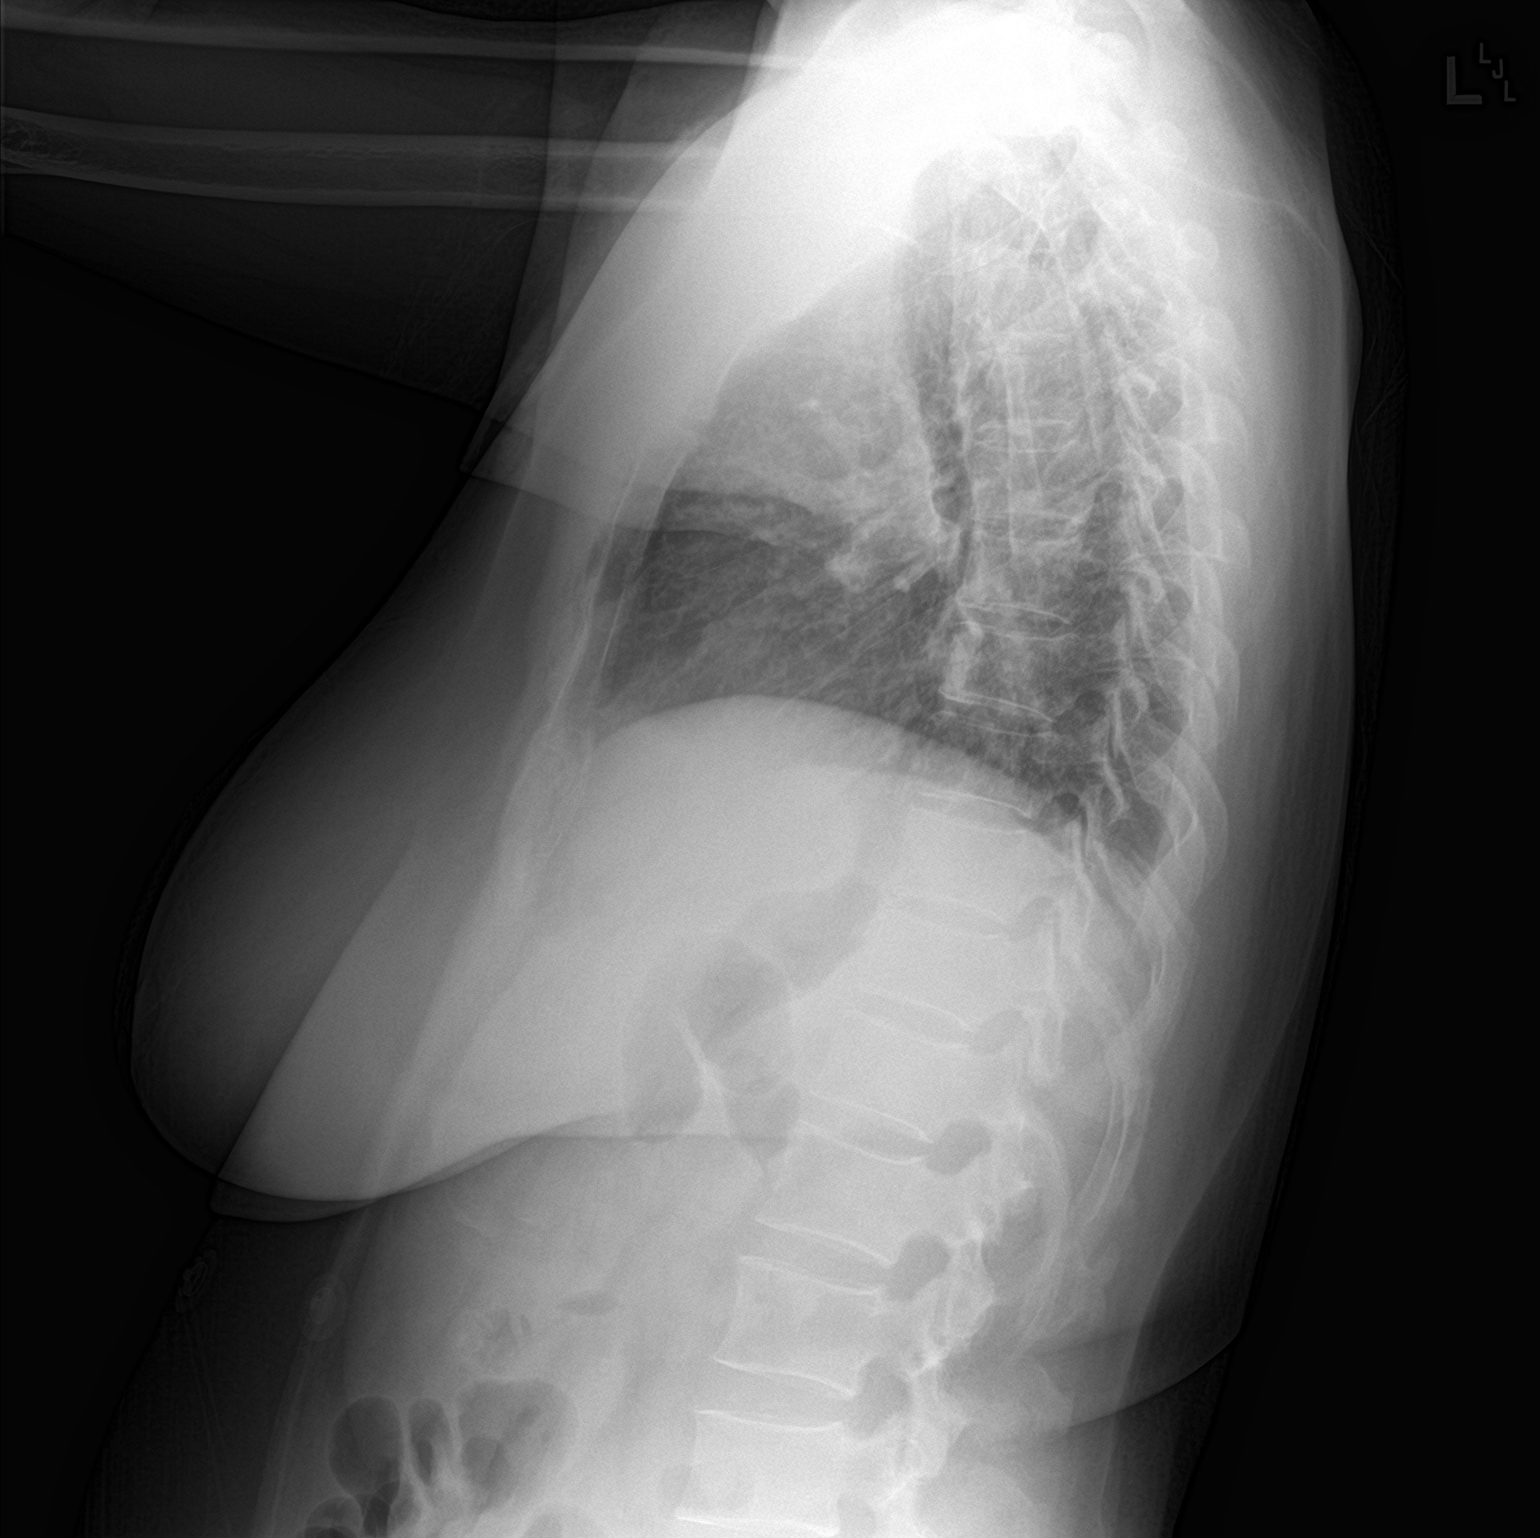

[2 of 2 positions shown; findings below may reference images not displayed]

FINDINGS: The heart size and mediastinal contours are within normal limits.
Both lungs are clear. The visualized skeletal structures are
unremarkable.
IMPRESSION: No active cardiopulmonary disease.

## 2024-07-11 ENCOUNTER — Ambulatory Visit
Admission: RE | Admit: 2024-07-11 | Discharge: 2024-07-11 | Disposition: A | Source: Ambulatory Visit | Attending: Adult Health | Admitting: Adult Health

## 2024-07-11 ENCOUNTER — Ambulatory Visit: Payer: Self-pay | Admitting: Adult Health

## 2024-07-11 ENCOUNTER — Encounter: Payer: Self-pay | Admitting: Adult Health

## 2024-07-11 VITALS — BP 136/84 | HR 70 | Temp 97.2°F | Ht 63.39 in | Wt 158.4 lb

## 2024-07-11 DIAGNOSIS — E785 Hyperlipidemia, unspecified: Secondary | ICD-10-CM | POA: Diagnosis not present

## 2024-07-11 DIAGNOSIS — Z23 Encounter for immunization: Secondary | ICD-10-CM | POA: Diagnosis not present

## 2024-07-11 DIAGNOSIS — Z1211 Encounter for screening for malignant neoplasm of colon: Secondary | ICD-10-CM

## 2024-07-11 DIAGNOSIS — R7303 Prediabetes: Secondary | ICD-10-CM

## 2024-07-11 DIAGNOSIS — Z0000 Encounter for general adult medical examination without abnormal findings: Secondary | ICD-10-CM

## 2024-07-11 DIAGNOSIS — Z113 Encounter for screening for infections with a predominantly sexual mode of transmission: Secondary | ICD-10-CM

## 2024-07-11 DIAGNOSIS — M79641 Pain in right hand: Secondary | ICD-10-CM

## 2024-07-11 DIAGNOSIS — Z1231 Encounter for screening mammogram for malignant neoplasm of breast: Secondary | ICD-10-CM

## 2024-07-11 DIAGNOSIS — K219 Gastro-esophageal reflux disease without esophagitis: Secondary | ICD-10-CM

## 2024-07-11 DIAGNOSIS — M79642 Pain in left hand: Secondary | ICD-10-CM

## 2024-07-11 DIAGNOSIS — Z1212 Encounter for screening for malignant neoplasm of rectum: Secondary | ICD-10-CM

## 2024-07-11 MED ORDER — PANTOPRAZOLE SODIUM 40 MG PO TBEC: 40.0000 mg | DELAYED_RELEASE_TABLET | Freq: Every day | ORAL | 1 refills | Status: DC

## 2024-07-11 MED ORDER — MELOXICAM 7.5 MG PO TABS: 7.5000 mg | ORAL_TABLET | Freq: Two times a day (BID) | ORAL | 1 refills | Status: AC | PRN

## 2024-07-11 NOTE — Progress Notes (Signed)
 [facility] clinic  Provider:  Jereld Serum DNP  Code Status:  Full Code  Goals of Care:     12/20/2021    2:10 AM  Advanced Directives  Does Patient Have a Medical Advance Directive? No     Chief Complaint  Patient presents with   Establish Care    Patient would like labs. Check for diabetes, cholesterol..   Discussed the use of AI scribe software for clinical note transcription with the patient, who gave verbal consent to proceed.  HPI: Patient is a 68 y.o. female seen today to establish care with [facility]. She is accompanied by her daughter.  She experiences bilateral hand pain, particularly in the wrists, occurring daily and worsening during sleep. The pain is persistent throughout the year without any associated trauma. She frequently massages her hands for relief and has been using Tylenol , which she finds ineffective. Her hands are also swollen. No shortness of breath, chest pain, or swelling in her feet.  She experiences occasional heartburn, described as a burning sensation in the chest, primarily in the morning. This has been ongoing for a while, and she has been using home remedies, including herbs and papaya leaves, without any prescribed medication. Regular bowel movements.  She does not consume alcohol, smoke, or use drugs. She works part-time at [employer]. She moved to the [place]  from [place] in [place] at the age of 43. She has three children, and her youngest was born in the [place] .       Past Medical History:  Diagnosis Date   Arthritis    GERD (gastroesophageal reflux disease)     Past Surgical History:  Procedure Laterality Date   CESAREAN SECTION  2001   baby was breeched    No Known Allergies  Outpatient Encounter Medications as of 07/11/2024  Medication Sig   acetaminophen  (TYLENOL ) 325 MG tablet Take 2 tablets (650 mg total) by mouth every 6 (six) hours as needed for moderate pain or fever.   meloxicam  (MOBIC ) 7.5 MG tablet Take 1  tablet (7.5 mg total) by mouth 2 (two) times daily as needed for pain. Take with food.   pantoprazole  (PROTONIX ) 40 MG tablet Take 1 tablet (40 mg total) by mouth daily.   [DISCONTINUED] acetaminophen  (TYLENOL ) 325 MG tablet Take 2 tablets (650 mg total) by mouth every 6 (six) hours as needed for up to 30 doses for headache, moderate pain, fever or mild pain. (Patient not taking: Reported on 07/11/2024)   [DISCONTINUED] amoxicillin -clavulanate (AUGMENTIN ) 875-125 MG per tablet Take 1 tablet by mouth 2 (two) times daily. (Patient not taking: Reported on 07/11/2024)   [DISCONTINUED] benzonatate  (TESSALON ) 100 MG capsule Take 1 capsule (100 mg total) by mouth 3 (three) times daily as needed for cough. (Patient not taking: Reported on 07/11/2024)   [DISCONTINUED] guaifenesin (ROBITUSSIN) 100 MG/5ML syrup Take 200 mg by mouth daily as needed for cough (and cold). (Patient not taking: Reported on 07/11/2024)   [DISCONTINUED] ibuprofen  (ADVIL ) 600 MG tablet Take 1 tablet (600 mg total) by mouth every 6 (six) hours as needed for up to 30 doses for mild pain, moderate pain, fever, headache or cramping. (Patient not taking: Reported on 07/11/2024)   [DISCONTINUED] ondansetron  (ZOFRAN  ODT) 4 MG disintegrating tablet Take 1 tablet (4 mg total) by mouth every 8 (eight) hours as needed for nausea or vomiting. (Patient not taking: Reported on 07/11/2024)   [DISCONTINUED] ondansetron  (ZOFRAN ) 4 MG tablet Take 1 tablet (4 mg total) by mouth every 8 (eight)  hours as needed for up to 15 doses for nausea or vomiting. (Patient not taking: Reported on 07/11/2024)   No facility-administered encounter medications on file as of 07/11/2024.    Review of Systems:  Review of Systems  Constitutional:  Negative for appetite change, chills, fatigue and fever.  HENT:  Negative for congestion, hearing loss, rhinorrhea and sore throat.   Eyes: Negative.   Respiratory:  Negative for cough, shortness of breath and wheezing.    Cardiovascular:  Negative for chest pain, palpitations and leg swelling.  Gastrointestinal:  Positive for abdominal pain. Negative for constipation, diarrhea, nausea and vomiting.  Genitourinary:  Negative for dysuria.  Musculoskeletal:  Positive for joint swelling. Negative for arthralgias, back pain and myalgias.       Bilateral hands painful  Skin:  Negative for color change, rash and wound.  Neurological:  Negative for dizziness, weakness and headaches.  Psychiatric/Behavioral:  Negative for behavioral problems. The patient is not nervous/anxious.     Health Maintenance  Topic Date Due   Medicare Annual Wellness (AWV)  Never done   DTaP/Tdap/Td (1 - Tdap) Never done   Mammogram  Never done   Colonoscopy  Never done   Zoster Vaccines- Shingrix (1 of 2) Never done   Bone Density Scan  Never done   COVID-19 Vaccine (1 - 2025-26 season) 07/27/2024 (Originally 04/17/2024)   Pneumococcal Vaccine: 50+ Years (1 of 1 - PCV) 07/11/2025 (Originally 01/04/2007)   Influenza Vaccine  Completed   Hepatitis C Screening  Completed   Meningococcal B Vaccine  Aged Out    Physical Exam: Vitals:   07/11/24 0837  BP: 136/84  Pulse: 70  Temp: (!) 97.2 F (36.2 C)  TempSrc: Temporal  SpO2: 97%  Weight: 158 lb 6.4 oz (71.8 kg)  Height: 5' 3.39 (1.61 m)   Body mass index is 27.72 kg/m. Physical Exam Constitutional:      Appearance: Normal appearance.  HENT:     Head: Normocephalic and atraumatic.     Nose: Nose normal.     Mouth/Throat:     Mouth: Mucous membranes are moist.  Eyes:     Conjunctiva/sclera: Conjunctivae normal.  Cardiovascular:     Rate and Rhythm: Normal rate and regular rhythm.  Pulmonary:     Effort: Pulmonary effort is normal.     Breath sounds: Normal breath sounds.  Abdominal:     General: Bowel sounds are normal.     Palpations: Abdomen is soft.  Musculoskeletal:        General: Normal range of motion.     Cervical back: Normal range of motion.      Comments: Bilateral hands 1+edema  Skin:    General: Skin is warm and dry.  Neurological:     General: No focal deficit present.     Mental Status: She is alert and oriented to person, place, and time.  Psychiatric:        Mood and Affect: Mood normal.        Behavior: Behavior normal.        Thought Content: Thought content normal.        Judgment: Judgment normal.     Labs reviewed: Basic Metabolic Panel: No results for input(s): NA, K, CL, CO2, GLUCOSE, BUN, CREATININE, CALCIUM, MG, PHOS, TSH in the last 8760 hours. Liver Function Tests: No results for input(s): AST, ALT, ALKPHOS, BILITOT, PROT, ALBUMIN in the last 8760 hours. No results for input(s): LIPASE, AMYLASE in the last 8760 hours. No results  for input(s): AMMONIA in the last 8760 hours. CBC: No results for input(s): WBC, NEUTROABS, HGB, HCT, MCV, PLT in the last 8760 hours. Lipid Panel: No results for input(s): CHOL, HDL, LDLCALC, TRIG, CHOLHDL, LDLDIRECT in the last 8760 hours. Lab Results  Component Value Date   HGBA1C 5.8 07/29/2016    Procedures since last visit: No results found.  Assessment/Plan  1. Gastroesophageal reflux disease without esophagitis -  Intermittent chest burning likely due to gastroesophageal reflux disease. Discussed Protonix  for symptom management. - Prescribed Protonix  40 mg daily, to be taken before breakfast. - Advised to take Protonix  30 minutes before eating. - Comprehensive metabolic panel - CBC with Differential/Platelets - pantoprazole  (PROTONIX ) 40 MG tablet; Take 1 tablet (40 mg total) by mouth daily.  Dispense: 90 tablet; Refill: 1  2. Pain in both hands -  Chronic bilateral hand pain and swelling, worse at night. Differential includes rheumatoid arthritis and gout. Tylenol  ineffective. Discussed potential rheumatoid arthritis and need for further evaluation. Considered Mobic  for pain management, with caution  due to potential gastritis. - Ordered rheumatoid factor and ANA tests. - Ordered uric acid test. - Prescribed Mobic  for pain management, to be taken with food. - Advised use of Tylenol  PRN for pain management. - Recommended warm compresses and arthritic gloves for symptom relief. - Will refer to rheumatology if rheumatoid arthritis is confirmed. - Comprehensive metabolic panel - CBC with Differential/Platelets - Rheumatoid Factor - ANA - Uric Acid - meloxicam  (MOBIC ) 7.5 MG tablet; Take 1 tablet (7.5 mg total) by mouth 2 (two) times daily as needed for pain. Take with food.  Dispense: 60 tablet; Refill: 1 - Sedimentation Rate - C-reactive Protein - DG HANDS 1 VIEW BILAT BALLCATCHERS  3. Prediabetes Lab Results  Component Value Date   HGBA1C 5.8 07/29/2016    - Hemoglobin A1c  4. Hyperlipidemia, unspecified hyperlipidemia type -  Discussed importance of monitoring lipid levels. - Lipid Panel  5. Need for influenza vaccination (Primary) - Flu vaccine HIGH DOSE PF(Fluzone Trivalent)  6. Adult wellness visit -  Routine wellness visit for a 68 year old female. Blood pressure 136/84 mmHg, BMI 27.7. No family history of diabetes. Menopause seven years ago. No significant memory issues. No alcohol, smoking, or drug use. - Ordered mammogram. - Referred to gastroenterology for colonoscopy. - Ordered CMP, blood sugar, A1c, tsh and lipid panel. - Discussed tetanus and shingles vaccines; advised to obtain from pharmacy. - Scheduled follow-up in 1-2 weeks to discuss lab results and treatment efficacy. - Comprehensive metabolic panel - CBC with Differential/Platelets - TSH  7. Screen for STD (sexually transmitted disease) - Hep C Antibody  8. Screening mammogram for breast cancer - MM 3D SCREENING MAMMOGRAM BILATERAL BREAST  9. Screening for colorectal cancer - Ambulatory referral to Gastroenterology      Labs/tests ordered:  CBC, CMP, lipid panel, mammogram, hep C antibody,  tsh   Return in about 2 weeks (around 07/25/2024).  Debbie Serpe Medina-Vargas, NP

## 2024-07-11 NOTE — Patient Instructions (Signed)
  Needs tetanus vaccine and shingles vaccine.

## 2024-07-13 LAB — C-REACTIVE PROTEIN: CRP: <3 mg/L (ref <8.0)

## 2024-07-13 LAB — RHEUMATOID FACTOR: Rheumatoid fact SerPl-aCnc: <10 [IU]/mL (ref <14)

## 2024-07-13 LAB — CBC WITH DIFFERENTIAL/PLATELET
Absolute Lymphocytes: 2400 {cells}/uL (ref 850–3900)
Absolute Monocytes: 383 {cells}/uL (ref 200–950)
Basophils Absolute: 28 {cells}/uL (ref 0–200)
Basophils Relative: 0.4 %
Eosinophils Absolute: 163 {cells}/uL (ref 15–500)
Eosinophils Relative: 2.3 %
HCT: 44 % (ref 35.9–46.0)
Hemoglobin: 13.8 g/dL (ref 11.7–15.5)
MCH: 24.4 pg — ABNORMAL LOW (ref 27.0–33.0)
MCHC: 31.4 g/dL — ABNORMAL LOW (ref 31.6–35.4)
MCV: 77.7 fL — ABNORMAL LOW (ref 81.4–101.7)
MPV: 10.2 fL (ref 7.5–12.5)
Monocytes Relative: 5.4 %
Neutro Abs: 4125 {cells}/uL (ref 1500–7800)
Neutrophils Relative %: 58.1 %
Platelets: 384 Thousand/uL (ref 140–400)
RBC: 5.66 Million/uL — ABNORMAL HIGH (ref 3.80–5.10)
RDW: 13.2 % (ref 11.0–15.0)
Total Lymphocyte: 33.8 %
WBC: 7.1 Thousand/uL (ref 3.8–10.8)

## 2024-07-13 LAB — COMPREHENSIVE METABOLIC PANEL WITH GFR
AG Ratio: 1.4 (calc) (ref 1.0–2.5)
ALT: 21 U/L (ref 6–29)
AST: 32 U/L (ref 10–35)
Albumin: 4.6 g/dL (ref 3.6–5.1)
Alkaline phosphatase (APISO): 70 U/L (ref 37–153)
BUN: 8 mg/dL (ref 7–25)
CO2: 28 mmol/L (ref 20–32)
Calcium: 10.4 mg/dL (ref 8.6–10.4)
Chloride: 102 mmol/L (ref 98–110)
Creat: 0.73 mg/dL (ref 0.50–1.05)
Globulin: 3.4 g/dL (ref 1.9–3.7)
Glucose, Bld: 119 mg/dL — ABNORMAL HIGH (ref 65–99)
Potassium: 4.2 mmol/L (ref 3.5–5.3)
Sodium: 140 mmol/L (ref 135–146)
Total Bilirubin: 1 mg/dL (ref 0.2–1.2)
Total Protein: 8 g/dL (ref 6.1–8.1)
eGFR: 90 mL/min/1.73m2 (ref >=60)

## 2024-07-13 LAB — HEMOGLOBIN A1C
Hgb A1c MFr Bld: 7.6 % — ABNORMAL HIGH (ref <5.7)
Mean Plasma Glucose: 171 mg/dL
eAG (mmol/L): 9.5 mmol/L

## 2024-07-13 LAB — SEDIMENTATION RATE: Sed Rate: 25 mm/h (ref 0–30)

## 2024-07-13 LAB — LIPID PANEL
Cholesterol: 246 mg/dL — ABNORMAL HIGH (ref <200)
HDL: 38 mg/dL — ABNORMAL LOW (ref >=50)
LDL Cholesterol (Calc): 160 mg/dL — ABNORMAL HIGH
Non-HDL Cholesterol (Calc): 208 mg/dL — ABNORMAL HIGH (ref <130)
Total CHOL/HDL Ratio: 6.5 (calc) — ABNORMAL HIGH (ref <5.0)
Triglycerides: 290 mg/dL — ABNORMAL HIGH (ref <150)

## 2024-07-13 LAB — ANA: Anti Nuclear Antibody (ANA): NEGATIVE

## 2024-07-13 LAB — URIC ACID: Uric Acid, Serum: 8.6 mg/dL — ABNORMAL HIGH (ref 2.5–7.0)

## 2024-07-13 LAB — HEPATITIS C ANTIBODY: Hepatitis C Ab: NONREACTIVE

## 2024-07-13 LAB — TSH: TSH: 1.74 m[IU]/L (ref 0.40–4.50)

## 2024-07-16 ENCOUNTER — Ambulatory Visit: Payer: Self-pay | Admitting: Adult Health

## 2024-07-16 DIAGNOSIS — M10249 Drug-induced gout, unspecified hand: Secondary | ICD-10-CM

## 2024-07-16 MED ORDER — COLCHICINE 0.6 MG PO TABS: 0.6000 mg | ORAL_TABLET | Freq: Every day | ORAL | 0 refills | Status: DC

## 2024-07-16 NOTE — Progress Notes (Signed)
-    electrolytes, liver enzymes, sedimentation rate, tsh, rheumatoid factor, CRP, all normal -  Hep C antibody negative -  lipid panel elevated, can discuss possible statin therapy  on follow up visit -  no anemia -  uric acid elevated, suggesting acute gout, sent eRX for Colchicine to pharmacy -  A1C elevated, ranging as type 2 diabetes, will discuss possible start of diabetic medication on next visit

## 2024-07-18 NOTE — Progress Notes (Signed)
-   bilateral hands has mild osteoarthritis, no acute problem - are the hands still painful?

## 2024-07-25 ENCOUNTER — Other Ambulatory Visit: Payer: Self-pay | Admitting: Adult Health

## 2024-07-25 ENCOUNTER — Encounter: Payer: Self-pay | Admitting: Adult Health

## 2024-07-25 ENCOUNTER — Ambulatory Visit: Admitting: Adult Health

## 2024-07-25 VITALS — BP 136/82 | HR 79 | Temp 97.7°F | Ht 63.39 in | Wt 157.4 lb

## 2024-07-25 DIAGNOSIS — E1169 Type 2 diabetes mellitus with other specified complication: Secondary | ICD-10-CM

## 2024-07-25 DIAGNOSIS — E782 Mixed hyperlipidemia: Secondary | ICD-10-CM | POA: Diagnosis not present

## 2024-07-25 DIAGNOSIS — M10049 Idiopathic gout, unspecified hand: Secondary | ICD-10-CM

## 2024-07-25 DIAGNOSIS — E119 Type 2 diabetes mellitus without complications: Secondary | ICD-10-CM | POA: Insufficient documentation

## 2024-07-25 DIAGNOSIS — K219 Gastro-esophageal reflux disease without esophagitis: Secondary | ICD-10-CM | POA: Diagnosis not present

## 2024-07-25 DIAGNOSIS — M109 Gout, unspecified: Secondary | ICD-10-CM | POA: Insufficient documentation

## 2024-07-25 DIAGNOSIS — Z7984 Long term (current) use of oral hypoglycemic drugs: Secondary | ICD-10-CM

## 2024-07-25 MED ORDER — COLCHICINE 0.6 MG PO TABS: 0.6000 mg | ORAL_TABLET | Freq: Every day | ORAL | 0 refills | Status: DC

## 2024-07-25 MED ORDER — METFORMIN HCL 1000 MG PO TABS: 1000.0000 mg | ORAL_TABLET | Freq: Two times a day (BID) | ORAL | 3 refills | Status: DC

## 2024-07-25 MED ORDER — BLOOD GLUCOSE MONITORING SUPPL DEVI: 1.0000 | Freq: Three times a day (TID) | 0 refills | Status: DC

## 2024-07-25 MED ORDER — LANCET DEVICE MISC: 1.0000 | Freq: Three times a day (TID) | 0 refills | Status: AC

## 2024-07-25 MED ORDER — BLOOD GLUCOSE TEST VI STRP: 1.0000 | ORAL_STRIP | Freq: Three times a day (TID) | 0 refills | Status: DC

## 2024-07-25 MED ORDER — LANCETS MISC: 1.0000 | 0 refills | Status: DC

## 2024-07-25 NOTE — Progress Notes (Signed)
 [facility] clinic  Provider:  Jereld Serum DNP  Code Status:  Full Code  Goals of Care:     12/20/2021    2:10 AM  Advanced Directives  Does Patient Have a Medical Advance Directive? No     Chief Complaint  Patient presents with   Medical Management of Chronic Issues    2 week follow up     Discussed the use of AI scribe software for clinical note transcription with the patient, who gave verbal consent to proceed.  HPI: Patient is a 68 y.o. female seen today for a 2-week follow up of chronic medical issues. She is accompanied by her daughter.  She has been experiencing discomfort in her hands, which has slightly improved since starting colchicine  0.6 mg daily. The discomfort is more pronounced at night. She was prescribed colchicine  due to elevated uric acid levels and has been on the medication for two weeks.  Her type 2 diabetes is poorly controlled, with a recent A1c of 7.6. She is not currently on diabetes medication. She experiences occasional neuropathic symptoms in her feet, described as 'pins and needles'.  She has mixed hyperlipidemia with high cholesterol levels. She is not on statin therapy currently due to potential interactions with colchicine .  She experiences occasional acid reflux but is not taking any medication for it, having stopped pantoprazole . She believes her symptoms are related to her high cholesterol.  She takes Mobic  as needed for pain but has reduced its use. She also uses Tylenol  as needed. She reports occasional constipation but no significant bowel movement issues. She does not engage in regular exercise, citing cold weather as a barrier, but acknowledges the need to increase physical activity.       Past Medical History:  Diagnosis Date   Arthritis    GERD (gastroesophageal reflux disease)     Past Surgical History:  Procedure Laterality Date   CESAREAN SECTION  2001   baby was breeched    No Known Allergies  Outpatient Encounter  Medications as of 07/25/2024  Medication Sig   Blood Glucose Monitoring Suppl DEVI 1 each by Does not apply route in the morning, at noon, and at bedtime. May substitute to any manufacturer covered by patient's insurance.   Glucose Blood (BLOOD GLUCOSE TEST STRIPS) STRP 1 each by In Vitro route in the morning, at noon, and at bedtime. May substitute to any manufacturer covered by patient's insurance.   Lancet Device MISC 1 each by Does not apply route in the morning, at noon, and at bedtime. May substitute to any manufacturer covered by patient's insurance.   Lancets MISC 1 each by Does not apply route as directed. Dispense based on patient and insurance preference. Use up to four times daily as directed. (FOR ICD-10 E10.9, E11.9).   meloxicam  (MOBIC ) 7.5 MG tablet Take 1 tablet (7.5 mg total) by mouth 2 (two) times daily as needed for pain. Take with food.   [DISCONTINUED] colchicine  0.6 MG tablet Take 1 tablet (0.6 mg total) by mouth daily for 14 days.   [DISCONTINUED] metFORMIN  (GLUCOPHAGE ) 1000 MG tablet Take 1 tablet (1,000 mg total) by mouth 2 (two) times daily with a meal.   [DISCONTINUED] pantoprazole  (PROTONIX ) 40 MG tablet Take 1 tablet (40 mg total) by mouth daily.   acetaminophen  (TYLENOL ) 325 MG tablet Take 2 tablets (650 mg total) by mouth every 6 (six) hours as needed for moderate pain or fever. (Patient not taking: Reported on 07/25/2024)   colchicine  0.6 MG tablet  Take 1 tablet (0.6 mg total) by mouth daily for 14 days.   metFORMIN  (GLUCOPHAGE ) 1000 MG tablet Take 1 tablet (1,000 mg total) by mouth 2 (two) times daily with a meal.   No facility-administered encounter medications on file as of 07/25/2024.    Review of Systems:  Review of Systems  Constitutional:  Negative for appetite change, chills, fatigue and fever.  HENT:  Negative for congestion, hearing loss, rhinorrhea and sore throat.   Eyes: Negative.   Respiratory:  Negative for cough, shortness of breath and wheezing.    Cardiovascular:  Negative for chest pain, palpitations and leg swelling.  Gastrointestinal:  Negative for abdominal pain, constipation, diarrhea, nausea and vomiting.  Genitourinary:  Negative for dysuria.  Musculoskeletal:  Positive for joint swelling. Negative for arthralgias, back pain and myalgias.       Hands tender  Skin:  Negative for color change, rash and wound.  Neurological:  Negative for dizziness, weakness and headaches.  Psychiatric/Behavioral:  Negative for behavioral problems. The patient is not nervous/anxious.     Health Maintenance  Topic Date Due   Medicare Annual Wellness (AWV)  Never done   FOOT EXAM  Never done   OPHTHALMOLOGY EXAM  Never done   Diabetic kidney evaluation - Urine ACR  Never done   DTaP/Tdap/Td (1 - Tdap) Never done   Mammogram  Never done   Colonoscopy  Never done   Zoster Vaccines- Shingrix (1 of 2) Never done   Bone Density Scan  Never done   COVID-19 Vaccine (1 - 2025-26 season) 07/27/2024 (Originally 04/17/2024)   Pneumococcal Vaccine: 50+ Years (1 of 2 - PCV) 07/11/2025 (Originally 01/04/1976)   HEMOGLOBIN A1C  01/08/2025   Diabetic kidney evaluation - eGFR measurement  07/11/2025   Influenza Vaccine  Completed   Hepatitis C Screening  Completed   Meningococcal B Vaccine  Aged Out    Physical Exam: Vitals:   07/25/24 1313  BP: 136/82  Pulse: 79  Temp: 97.7 F (36.5 C)  TempSrc: Temporal  SpO2: 98%  Weight: 157 lb 6.4 oz (71.4 kg)  Height: 5' 3.39 (1.61 m)   Body mass index is 27.54 kg/m. Physical Exam Constitutional:      Appearance: Normal appearance.  HENT:     Head: Normocephalic and atraumatic.     Nose: Nose normal.     Mouth/Throat:     Mouth: Mucous membranes are moist.  Eyes:     Conjunctiva/sclera: Conjunctivae normal.  Cardiovascular:     Rate and Rhythm: Normal rate and regular rhythm.  Pulmonary:     Effort: Pulmonary effort is normal.     Breath sounds: Normal breath sounds.  Abdominal:     General:  Bowel sounds are normal.     Palpations: Abdomen is soft.  Musculoskeletal:        General: Normal range of motion.     Cervical back: Normal range of motion.  Skin:    General: Skin is warm and dry.  Neurological:     General: No focal deficit present.     Mental Status: She is alert and oriented to person, place, and time.  Psychiatric:        Mood and Affect: Mood normal.        Behavior: Behavior normal.        Thought Content: Thought content normal.        Judgment: Judgment normal.     Labs reviewed: Basic Metabolic Panel: Recent Labs    07/11/24  0933  NA 140  K 4.2  CL 102  CO2 28  GLUCOSE 119*  BUN 8  CREATININE 0.73  CALCIUM 10.4  TSH 1.74   Liver Function Tests: Recent Labs    07/11/24 0933  AST 32  ALT 21  BILITOT 1.0  PROT 8.0   No results for input(s): LIPASE, AMYLASE in the last 8760 hours. No results for input(s): AMMONIA in the last 8760 hours. CBC: Recent Labs    07/11/24 0933  WBC 7.1  NEUTROABS 4,125  HGB 13.8  HCT 44.0  MCV 77.7*  PLT 384   Lipid Panel: Recent Labs    07/11/24 0933  CHOL 246*  HDL 38*  LDLCALC 160*  TRIG 290*  CHOLHDL 6.5*   Lab Results  Component Value Date   HGBA1C 7.6 (H) 07/11/2024    Procedures since last visit: DG HANDS 1 VIEW BILAT BALLCATCHERS Result Date: 07/18/2024 CLINICAL DATA:  Bilateral hand pain for 4-5 years. No previous relevant surgery. EXAM: HAND LIMITED 1 VIEW COMPARISON:  None Available. FINDINGS: The mineralization and alignment are normal. There is no evidence of acute fracture or dislocation. Mild radiocarpal and interphalangeal joint space narrowing in both hands. The metacarpal phalangeal and intercarpal joint spaces are preserved. No erosive changes or focal soft tissue abnormalities are seen. IMPRESSION: Mild degenerative changes in both hands as described. No acute osseous findings or evidence of inflammatory arthropathy. Electronically Signed   By: Elsie Perone M.D.    On: 07/18/2024 16:31    Assessment/Plan  1. Acute idiopathic gout of hand, unspecified laterality (Primary) -  Elevated uric acid levels with improved hand pain on colchicine . Pain persists but is less severe. - Continue colchicine  0.6 mg daily for a total of 1 month. - Advised to avoid organ meats. - Encouraged exercise and low-carb diet. - colchicine  0.6 MG tablet; Take 1 tablet (0.6 mg total) by mouth daily for 14 days.  Dispense: 14 tablet; Refill: 0  2. Type 2 diabetes mellitus with other specified complication, without long-term current use of insulin (HCC) -  A1c 7.6 indicates poor glycemic control. Occasional neuropathic symptoms in feet. Kidney function allows metformin  use. - Started metformin  1000 mg twice daily. - Ordered blood glucose monitoring device. - Instructed daily blood glucose checks before breakfast. - Encouraged 150 minutes of exercise per week. - Advised low-carb diet with portion control, especially for rice. - Scheduled follow-up in one month. - metFORMIN  (GLUCOPHAGE ) 1000 MG tablet; Take 1 tablet (1,000 mg total) by mouth 2 (two) times daily with a meal.  Dispense: 180 tablet; Refill: 3 - Microalbumin/Creatinine Ratio, Urine  3. Mixed hyperlipidemia Lab Results  Component Value Date   CHOL 246 (H) 07/11/2024   HDL 38 (L) 07/11/2024   LDLCALC 160 (H) 07/11/2024   LDLDIRECT 106 (H) 08/25/2012   TRIG 290 (H) 07/11/2024   CHOLHDL 6.5 (H) 07/11/2024    -  discussed statin -  agreed to start on statin once Colchicine  completed -  advised to eat low fat diet  4. Gastroesophageal reflux disease without esophagitis -  Symptoms likely related to diabetes and high cholesterol, not GERD. - Discontinued Protonix .       Labs/tests ordered:   urine microalbumin creatinine ratio   Return in about 4 weeks (around 08/22/2024).  Debbie Ransom Medina-Vargas, NP

## 2024-07-26 ENCOUNTER — Ambulatory Visit: Payer: Self-pay | Admitting: Adult Health

## 2024-07-26 ENCOUNTER — Telehealth: Payer: Self-pay

## 2024-07-26 LAB — MICROALBUMIN / CREATININE URINE RATIO
Creatinine, Urine: 242 mg/dL (ref 20–275)
Microalb Creat Ratio: 17 mg/g{creat} (ref <30)
Microalb, Ur: 4 mg/dL

## 2024-07-26 NOTE — Telephone Encounter (Signed)
 Copied from CRM #[id number]. Topic: Clinical - Medication Question >> Jul 26, 2024 10:39 AM Alfonso ORN wrote: Reason for CRM: Patient was seen yesterday and need clarification if provider Monina c Medina-Vargas want patient to  continue taking the  medication colchicine  0.6 MG tablet , this was discuss at the visit that provider want patient to continue the medication and patient only have 3 or 4 pills of the medication left if need to continue would need a medication refill put in   Ashlley Booher (Daughter) Ph# [phone] ----------------------------------------------------------------------- From previous Reason for Contact - Medication Refill: Medication:   Has the patient contacted their pharmacy?   (Agent: If no, request that the patient contact the pharmacy for the refill. If patient does not wish to contact the pharmacy document the reason why and proceed with request.) (Agent: If yes, when and what did the pharmacy advise?)  This is the patient's preferred pharmacy:  [id number] - [place], [place] - [address] AT [facility] [address] [place] [address] Phone: [phone] Fax: [phone]  Is this the correct pharmacy for this prescription?   If no, delete pharmacy and type the correct one.   Has the prescription been filled recently?    Is the patient out of the medication?    Has the patient been seen for an appointment in the last year OR does the patient have an upcoming appointment?    Can we respond through MyChart?    Agent: Please be advised that Rx refills may take up to 3 business days. We ask that you follow-up with your pharmacy.

## 2024-07-26 NOTE — Progress Notes (Signed)
-    result of urine microalbumin creatinine ratio is normal.

## 2024-07-26 NOTE — Telephone Encounter (Signed)
 I spoke w/ pharmacy confirmed order has been received per pharmacy refill to soon to fill will be available tomorrow. Pt daughter aware and voiced understanding. FYI: Pharmacy will text message when rx ready for pick up.

## 2024-07-28 ENCOUNTER — Encounter (HOSPITAL_COMMUNITY): Payer: Self-pay

## 2024-07-28 ENCOUNTER — Emergency Department (HOSPITAL_COMMUNITY)

## 2024-07-28 ENCOUNTER — Telehealth: Payer: Self-pay

## 2024-07-28 ENCOUNTER — Emergency Department (HOSPITAL_COMMUNITY)
Admission: EM | Admit: 2024-07-28 | Discharge: 2024-07-28 | Disposition: A | Discharge status: Home / Self Care | Attending: Emergency Medicine | Admitting: Emergency Medicine

## 2024-07-28 ENCOUNTER — Other Ambulatory Visit: Payer: Self-pay

## 2024-07-28 ENCOUNTER — Ambulatory Visit: Payer: Self-pay

## 2024-07-28 DIAGNOSIS — R1084 Generalized abdominal pain: Secondary | ICD-10-CM | POA: Insufficient documentation

## 2024-07-28 DIAGNOSIS — T50905A Adverse effect of unspecified drugs, medicaments and biological substances, initial encounter: Secondary | ICD-10-CM

## 2024-07-28 DIAGNOSIS — Z7984 Long term (current) use of oral hypoglycemic drugs: Secondary | ICD-10-CM | POA: Insufficient documentation

## 2024-07-28 DIAGNOSIS — E119 Type 2 diabetes mellitus without complications: Secondary | ICD-10-CM | POA: Insufficient documentation

## 2024-07-28 DIAGNOSIS — D72819 Decreased white blood cell count, unspecified: Secondary | ICD-10-CM | POA: Insufficient documentation

## 2024-07-28 DIAGNOSIS — T504X5A Adverse effect of drugs affecting uric acid metabolism, initial encounter: Secondary | ICD-10-CM | POA: Insufficient documentation

## 2024-07-28 DIAGNOSIS — T887XXA Unspecified adverse effect of drug or medicament, initial encounter: Secondary | ICD-10-CM | POA: Insufficient documentation

## 2024-07-28 LAB — COMPREHENSIVE METABOLIC PANEL WITH GFR
ALT: 34 U/L (ref 0–44)
AST: 52 U/L — ABNORMAL HIGH (ref 15–41)
Albumin: 3.9 g/dL (ref 3.5–5.0)
Alkaline Phosphatase: 72 U/L (ref 38–126)
Anion gap: 13 (ref 5–15)
BUN: 11 mg/dL (ref 8–23)
CO2: 26 mmol/L (ref 22–32)
Calcium: 9.7 mg/dL (ref 8.9–10.3)
Chloride: 100 mmol/L (ref 98–111)
Creatinine, Ser: 0.79 mg/dL (ref 0.44–1.00)
GFR, Estimated: >60 mL/min (ref >60)
Glucose, Bld: 144 mg/dL — ABNORMAL HIGH (ref 70–99)
Potassium: 3.5 mmol/L (ref 3.5–5.1)
Sodium: 139 mmol/L (ref 135–145)
Total Bilirubin: 1.2 mg/dL (ref 0.0–1.2)
Total Protein: 7.7 g/dL (ref 6.5–8.1)

## 2024-07-28 LAB — URINALYSIS, ROUTINE W REFLEX MICROSCOPIC
Bilirubin Urine: NEGATIVE
Glucose, UA: NEGATIVE mg/dL
Hgb urine dipstick: NEGATIVE
Ketones, ur: NEGATIVE mg/dL
Nitrite: NEGATIVE
Protein, ur: NEGATIVE mg/dL
Specific Gravity, Urine: 1.013 (ref 1.005–1.030)
pH: 5 (ref 5.0–8.0)

## 2024-07-28 LAB — CBC
HCT: 42.7 % (ref 36.0–46.0)
Hemoglobin: 13.7 g/dL (ref 12.0–15.0)
MCH: 24.6 pg — ABNORMAL LOW (ref 26.0–34.0)
MCHC: 32.1 g/dL (ref 30.0–36.0)
MCV: 76.5 fL — ABNORMAL LOW (ref 80.0–100.0)
Platelets: 342 K/uL (ref 150–400)
RBC: 5.58 MIL/uL — ABNORMAL HIGH (ref 3.87–5.11)
RDW: 13 % (ref 11.5–15.5)
WBC: 7.8 K/uL (ref 4.0–10.5)
nRBC: 0 % (ref 0.0–0.2)

## 2024-07-28 LAB — LIPASE, BLOOD: Lipase: 38 U/L (ref 11–51)

## 2024-07-28 MED ORDER — MORPHINE SULFATE (PF) 4 MG/ML IV SOLN
4.0000 mg | Freq: Once | INTRAVENOUS | Status: AC
  Administered 2024-07-28: 4 mg via INTRAVENOUS
  Filled 2024-07-28: qty 1

## 2024-07-28 MED ORDER — IOHEXOL 350 MG/ML SOLN
75.0000 mL | Freq: Once | INTRAVENOUS | Status: AC | PRN
  Administered 2024-07-28: 75 mL via INTRAVENOUS

## 2024-07-28 MED ORDER — ONDANSETRON HCL 4 MG/2ML IJ SOLN
4.0000 mg | Freq: Once | INTRAMUSCULAR | Status: AC
  Administered 2024-07-28: 4 mg via INTRAVENOUS
  Filled 2024-07-28: qty 2

## 2024-07-28 MED ORDER — DICYCLOMINE HCL 20 MG PO TABS: 20.0000 mg | ORAL_TABLET | Freq: Two times a day (BID) | ORAL | 0 refills | Status: DC | PRN

## 2024-07-28 MED ORDER — ONDANSETRON 4 MG PO TBDP: 4.0000 mg | ORAL_TABLET | Freq: Four times a day (QID) | ORAL | 0 refills | Status: AC | PRN

## 2024-07-28 NOTE — ED Triage Notes (Signed)
 Pt arrived from home via POV c/o abd pain 8/10 cramping and fatigue. Pt believes that it may be her new medication that she started

## 2024-07-28 NOTE — Telephone Encounter (Signed)
 FYI Only or Action Required?: FYI only for provider: appointment scheduled on 07/31/24.  Patient was last seen in primary care on 07/25/2024 by Medina-Vargas, Jereld BROCKS, NP.  Called Nurse Triage reporting No chief complaint on file..  Symptoms began yesterday.  Interventions attempted: Other: in ED.  Symptoms are: stable.  Triage Disposition: No disposition on file.  Patient/caregiver understands and will follow disposition?:   Reached patient's daughter Glenys, she believes the Metformin  that was recently prescribed (07/26/24) is upsetting her stomach. Patient feeling weak, no appetite, SOB. Patient is currently in ED. Daughter wanted to schedule appointment with PCP to discuss the symptoms and medication regimen.

## 2024-07-28 NOTE — Telephone Encounter (Signed)
 Copied from CRM #[id number]. Topic: Clinical - Pink Word Triage >> Jul 28, 2024 10:51 AM Marda MATSU wrote: Pink Word triggered transfer to Nurse Triage. See Triage Message for details. >> Jul 28, 2024 11:01 AM Marda MATSU wrote: Spoke with Medical Assistant Churay in the office and she said there were no appointments in the office and advised that daughter tell mother to remain at the hospital until seen.  >> Jul 28, 2024 10:53 AM Carrielelia G wrote: Reason for Triage: Daughter calling regarding mother currently in [facility] ER. She has questions regarding her symptoms and the medication she is taking please advise

## 2024-07-28 NOTE — Telephone Encounter (Signed)
 Noted

## 2024-07-28 NOTE — Telephone Encounter (Signed)
 Attempted to return call but NA and VM not set up so unable to LM. Attempt #1   Message from [facility] G sent at 07/28/2024 10:53 AM EST  Summary: Medication and symptoms   Reason for Triage: Daughter calling regarding mother currently in [facility] ER. She has questions regarding her symptoms and the medication she is taking please advise

## 2024-07-28 NOTE — Telephone Encounter (Signed)
 Outgoing call placed to [facility], spoke with Abby and she confirmed rx for colchicine  was received and is available for pick up

## 2024-07-28 NOTE — Telephone Encounter (Signed)
 Copied from CRM #[id number]. Topic: Clinical - Prescription Issue >> Jul 28, 2024 10:55 AM Marda MATSU wrote: Reason for CRM: colchicine  0.6 MG tablet  was not at pharmacy per daughter   Please advise

## 2024-07-28 NOTE — Discharge Instructions (Addendum)
 You can stop taking the metformin  medication, talk to your primary care doctor about starting a different medication for your blood sugar.  You can use the nausea medication up to every 6 hours as needed and the Bentyl medication for abdominal cramping up to every 12 hours as needed

## 2024-07-28 NOTE — ED Provider Notes (Signed)
 [place] EMERGENCY DEPARTMENT AT [facility] Provider Note   CSN: [id number] Arrival date & time: 07/28/24  9744     Patient presents with: Abdominal Pain and Fatigue   APRILLE SAWHNEY is a 68 y.o. female with past medical history significant for GERD, gout, hyperlipidemia, diabetes who presents to concern for 8 out of 10 cramping abdominal pain, fatigue.  She is wondering whether it could be related to the new medication that she started.  On colchicine  recently for gout.  Also started 1000 mg twice daily metformin  yesterday.    Abdominal Pain      Prior to Admission medications  Medication Sig Start Date End Date Taking? Authorizing Provider  Accu-Chek Softclix Lancets lancets 100 each by Other route daily. 1 time per day 07/25/24   Medina-Vargas, Monina C, NP  acetaminophen  (TYLENOL ) 325 MG tablet Take 2 tablets (650 mg total) by mouth every 6 (six) hours as needed for moderate pain or fever. Patient not taking: Reported on 07/25/2024 10/13/18   Griselda Norris, MD  Blood Glucose Monitoring Suppl (ACCU-CHEK GUIDE) w/Device KIT 1 Device by Other route daily. E11.69 07/25/24   Medina-Vargas, Monina C, NP  colchicine  0.6 MG tablet Take 1 tablet (0.6 mg total) by mouth daily for 14 days. 07/25/24 08/08/24  Medina-Vargas, Monina C, NP  dicyclomine (BENTYL) 20 MG tablet Take 1 tablet (20 mg total) by mouth 2 (two) times daily as needed for spasms. 07/28/24  Yes Axavier Pressley H, PA-C  glucose blood (ACCU-CHEK GUIDE TEST) test strip 1 each by Other route daily. E11.69 07/25/24   Medina-Vargas, Monina C, NP  Lancet Device MISC 1 each by Does not apply route in the morning, at noon, and at bedtime. May substitute to any manufacturer covered by patient's insurance. 07/25/24 08/24/24  Medina-Vargas, Monina C, NP  meloxicam  (MOBIC ) 7.5 MG tablet Take 1 tablet (7.5 mg total) by mouth 2 (two) times daily as needed for pain. Take with food. 07/11/24   Medina-Vargas, Monina C, NP  metFORMIN   (GLUCOPHAGE ) 1000 MG tablet Take 1 tablet (1,000 mg total) by mouth 2 (two) times daily with a meal. 07/25/24   Medina-Vargas, Monina C, NP  ondansetron  (ZOFRAN -ODT) 4 MG disintegrating tablet Take 1 tablet (4 mg total) by mouth every 6 (six) hours as needed for nausea or vomiting. 07/28/24  Yes Zach Tietje H, PA-C    Allergies: Patient has no known allergies.    Review of Systems  Gastrointestinal:  Positive for abdominal pain.  All other systems reviewed and are negative.   Updated Vital Signs BP (!) 153/83   Pulse 74   Temp 98.3 F (36.8 C) (Oral)   Resp 18   Ht 5' 3 (1.6 m)   Wt 71.3 kg   LMP  (LMP Unknown)   SpO2 99%   BMI 27.84 kg/m   Physical Exam Vitals and nursing note reviewed.  Constitutional:      General: She is not in acute distress.    Appearance: Normal appearance.  HENT:     Head: Normocephalic and atraumatic.  Eyes:     General:        Right eye: No discharge.        Left eye: No discharge.  Cardiovascular:     Rate and Rhythm: Normal rate and regular rhythm.     Heart sounds: No murmur heard.    No friction rub. No gallop.  Pulmonary:     Effort: Pulmonary effort is normal.  Breath sounds: Normal breath sounds.  Abdominal:     General: Bowel sounds are normal.     Palpations: Abdomen is soft.     Comments: Diffuse tenderness to palpation throughout the abdomen, no rebound, rigidity, guarding  Skin:    General: Skin is warm and dry.     Capillary Refill: Capillary refill takes less than 2 seconds.  Neurological:     Mental Status: She is alert and oriented to person, place, and time.  Psychiatric:        Mood and Affect: Mood normal.        Behavior: Behavior normal.     (all labs ordered are listed, but only abnormal results are displayed) Labs Reviewed  COMPREHENSIVE METABOLIC PANEL WITH GFR - Abnormal; Notable for the following components:      Result Value   Glucose, Bld 144 (*)    AST 52 (*)    All other components  within normal limits  CBC - Abnormal; Notable for the following components:   RBC 5.58 (*)    MCV 76.5 (*)    MCH 24.6 (*)    All other components within normal limits  URINALYSIS, ROUTINE W REFLEX MICROSCOPIC - Abnormal; Notable for the following components:   APPearance HAZY (*)    Leukocytes,Ua LARGE (*)    Bacteria, UA RARE (*)    All other components within normal limits  LIPASE, BLOOD    EKG: None  Radiology: CT ABDOMEN PELVIS W CONTRAST Result Date: 07/28/2024 CLINICAL DATA:  Abdominal pain, acute, nonlocalized EXAM: CT ABDOMEN AND PELVIS WITH CONTRAST TECHNIQUE: Multidetector CT imaging of the abdomen and pelvis was performed using the standard protocol following bolus administration of intravenous contrast. RADIATION DOSE REDUCTION: This exam was performed according to the departmental dose-optimization program which includes automated exposure control, adjustment of the mA and/or kV according to patient size and/or use of iterative reconstruction technique. CONTRAST:  75mL OMNIPAQUE IOHEXOL 350 MG/ML SOLN COMPARISON:  None Available. FINDINGS: Lower chest: Ground-glass changes at the left lung base are nonspecific and could reflect mosaic attenuation versus infectious/inflammatory process. Hepatobiliary: Diffusely decreased attenuation of the a patent parenchyma, suggestive of hepatic steatosis. No suspicious focal hepatic lesion. Gallbladder is unremarkable. No biliary dilatation. Pancreas: Unremarkable. No pancreatic ductal dilatation or surrounding inflammatory changes. Spleen: Normal in size without focal abnormality. Adrenals/Urinary Tract: Adrenal glands are unremarkable. A few bilateral subcentimeter focal hypodensities are too small to definitively characterize. No urolithiasis or hydronephrosis. Bladder is unremarkable. Stomach/Bowel: Stomach is within normal limits. No evidence of obstruction or inflammatory changes. Appendix appears normal. Mild sigmoid colonic diverticula.  Vascular/Lymphatic: Abdominal aorta is normal in caliber with atherosclerotic calcification. No enlarged abdominal or pelvic lymph nodes. Reproductive: Uterus and bilateral adnexa are unremarkable. Other: No abdominopelvic ascites. No intraperitoneal free air. No abdominal wall hernia. Musculoskeletal: No acute osseous abnormality. No suspicious osseous lesion. IMPRESSION: 1. No acute localizing findings in the abdomen or pelvis. 2. Mild sigmoid colonic diverticulosis. 3. Hepatic steatosis. 4. Ground-glass changes at the left lung base are nonspecific and could reflect mosaic attenuation versus an infectious/inflammatory process. 5.  Aortic Atherosclerosis (ICD10-I70.0). Electronically Signed   By: Harrietta Sherry M.D.   On: 07/28/2024 14:05     Procedures   Medications Ordered in the ED  morphine (PF) 4 MG/ML injection 4 mg (4 mg Intravenous Given 07/28/24 1250)  ondansetron  (ZOFRAN ) injection 4 mg (4 mg Intravenous Given 07/28/24 1250)  iohexol (OMNIPAQUE) 350 MG/ML injection 75 mL (75 mLs Intravenous Contrast Given 07/28/24  1330)                                    Medical Decision Making Amount and/or Complexity of Data Reviewed Labs: ordered. Radiology: ordered.  Risk Prescription drug management.   This patient is a 68 y.o. female  who presents to the ED for concern of abdominal pain.   Differential diagnoses prior to evaluation: The emergent differential diagnosis includes, but is not limited to,  The causes of generalized abdominal pain include but are not limited to AAA, mesenteric ischemia, appendicitis, diverticulitis, DKA, gastritis, gastroenteritis, AMI, nephrolithiasis, pancreatitis, peritonitis, adrenal insufficiency,lead poisoning, iron toxicity, intestinal ischemia, constipation, UTI,SBO/LBO, splenic rupture, biliary disease, IBD, IBS, PUD, or hepatitis. This is not an exhaustive differential.   Past Medical History / Co-morbidities / Social History: GERD, gout,  hyperlipidemia, diabetes  Additional history: Chart reviewed. Pertinent results include: reviewed labwork, imaging from previous ED visits, outpatient family medicine visits  Physical Exam: Physical exam performed. The pertinent findings include:  Diffuse tenderness to palpation throughout the abdomen, no rebound, rigidity, guarding   Vital signs stable other than some hypertension -- initially 189/95, improved to 153/83 on recheck.   Lab Tests/Imaging studies: I personally interpreted labs/imaging and the pertinent results include: CBC overall unremarkable, CMP overall unremarkable other than mildly elevated glucose at 144, normal lipase.  Her UA does have some large leukocytes, 21-50 white blood cells and rare bacteria, no squamous contamination, may consider treating for urinary tract infection. CTAP shows  1. No acute localizing findings in the abdomen or pelvis.  2. Mild sigmoid colonic diverticulosis.  3. Hepatic steatosis.  4. Ground-glass changes at the left lung base are nonspecific and  could reflect mosaic attenuation versus an infectious/inflammatory  process.   I agree with the radiologist interpretation.   Medications: I ordered medication including morphine, zofran  for pain.  I have reviewed the patients home medicines and have made adjustments as needed.   Disposition: After consideration of the diagnostic results and the patients response to treatment, I feel that patient okay to discontinue the metformin , speak to her primary care doctor about other options for glucose control.   emergency department workup does not suggest an emergent condition requiring admission or immediate intervention beyond what has been performed at this time. The plan is: as above. The patient is safe for discharge and has been instructed to return immediately for worsening symptoms, change in symptoms or any other concerns.   Final diagnoses:  Generalized abdominal pain  Adverse effect of  drug, initial encounter    ED Discharge Orders          Ordered    ondansetron  (ZOFRAN -ODT) 4 MG disintegrating tablet  Every 6 hours PRN        07/28/24 1432    dicyclomine (BENTYL) 20 MG tablet  2 times daily PRN        07/28/24 1432               Rosha Cocker, Malden H, PA-C 07/28/24 1432    Tegeler, Lonni PARAS, MD 07/28/24 1537

## 2024-07-31 ENCOUNTER — Encounter: Payer: Self-pay | Admitting: Adult Health

## 2024-07-31 ENCOUNTER — Ambulatory Visit: Admitting: Adult Health

## 2024-07-31 VITALS — BP 130/72 | HR 83 | Temp 97.9°F | Ht 63.0 in | Wt 151.8 lb

## 2024-07-31 DIAGNOSIS — E782 Mixed hyperlipidemia: Secondary | ICD-10-CM

## 2024-07-31 DIAGNOSIS — E1169 Type 2 diabetes mellitus with other specified complication: Secondary | ICD-10-CM | POA: Diagnosis not present

## 2024-07-31 DIAGNOSIS — M10049 Idiopathic gout, unspecified hand: Secondary | ICD-10-CM | POA: Diagnosis not present

## 2024-07-31 DIAGNOSIS — K219 Gastro-esophageal reflux disease without esophagitis: Secondary | ICD-10-CM

## 2024-07-31 MED ORDER — PANTOPRAZOLE SODIUM 40 MG PO TBEC: 40.0000 mg | DELAYED_RELEASE_TABLET | Freq: Every day | ORAL | 3 refills | Status: AC | PRN

## 2024-07-31 MED ORDER — PANTOPRAZOLE SODIUM 40 MG PO TBEC: 40.0000 mg | DELAYED_RELEASE_TABLET | Freq: Every day | ORAL | 3 refills | Status: DC

## 2024-07-31 MED ORDER — DICYCLOMINE HCL 20 MG PO TABS: 20.0000 mg | ORAL_TABLET | Freq: Two times a day (BID) | ORAL | 0 refills | Status: AC | PRN

## 2024-07-31 NOTE — Progress Notes (Signed)
 [facility] clinic  Provider:  Jereld Serum DNP  Code Status:  Full Code Goals of Care:      07/28/2024    3:16 AM  Advanced Directives  Does Patient Have a Medical Advance Directive? No  Would patient like information on creating a medical advance directive? No - Patient declined     Chief Complaint  Patient presents with   Follow-up    Patient was seen in hospital 07/28/2024 for abdominal pain.    Abdominal Pain    Patient was seen in ER for abdominal pain 12 /07/2024. Patient has pain middle of abdomin and level at 2/10 right now states medication helped that was given in the ER.    Discussed the use of AI scribe software for clinical note transcription with the patient, who gave verbal consent to proceed.  HPI: Patient is a 68 y.o. female seen today for a hospitalization follow up.   She was seen in the ED on July 28, 2024, due to abdominal pain. A CT scan of the abdomen was performed, and her symptoms began after starting metformin , which she was taking twice daily. She was prescribed Bentyl  for abdominal pain during her hospital visit, to be taken twice daily as needed.  She experiences ongoing chest discomfort, described as radiating from the upper abdomen to the chest, with a severity of 4-5 out of 10. She has not been taking Tums for this discomfort. She is also taking pantoprazole  40 mg as needed for acid reflux and uses it occasionally when she feels symptoms.  She has a history of fatty liver, noted during her recent hospital visit. She experiences pain during urination, which started recently, but previous urine tests were negative for infection.  She is currently taking colchicine  for gout, but there was an issue with her prescription, leading to a lapse in medication and worsening of her symptoms, including swelling of her hands.    Past Medical History:  Diagnosis Date   Arthritis    GERD (gastroesophageal reflux disease)     Past Surgical History:   Procedure Laterality Date   CESAREAN SECTION  2001   baby was breeched    Allergies[1]  Outpatient Encounter Medications as of 07/31/2024  Medication Sig   Accu-Chek Softclix Lancets lancets 100 each by Other route daily. 1 time per day   Blood Glucose Monitoring Suppl (ACCU-CHEK GUIDE) w/Device KIT 1 Device by Other route daily. E11.69   colchicine  0.6 MG tablet Take 1 tablet (0.6 mg total) by mouth daily for 14 days.   glucose blood (ACCU-CHEK GUIDE TEST) test strip 1 each by Other route daily. E11.69   Lancet Device MISC 1 each by Does not apply route in the morning, at noon, and at bedtime. May substitute to any manufacturer covered by patient's insurance.   meloxicam  (MOBIC ) 7.5 MG tablet Take 1 tablet (7.5 mg total) by mouth 2 (two) times daily as needed for pain. Take with food.   ondansetron  (ZOFRAN -ODT) 4 MG disintegrating tablet Take 1 tablet (4 mg total) by mouth every 6 (six) hours as needed for nausea or vomiting.   [DISCONTINUED] dicyclomine  (BENTYL ) 20 MG tablet Take 1 tablet (20 mg total) by mouth 2 (two) times daily as needed for spasms.   [DISCONTINUED] pantoprazole  (PROTONIX ) 40 MG tablet Take 1 tablet (40 mg total) by mouth daily.   acetaminophen  (TYLENOL ) 325 MG tablet Take 2 tablets (650 mg total) by mouth every 6 (six) hours as needed for moderate pain or fever. (Patient  not taking: Reported on 07/25/2024)   dicyclomine  (BENTYL ) 20 MG tablet Take 1 tablet (20 mg total) by mouth 2 (two) times daily as needed for spasms.   pantoprazole  (PROTONIX ) 40 MG tablet Take 1 tablet (40 mg total) by mouth daily as needed.   [DISCONTINUED] metFORMIN  (GLUCOPHAGE ) 1000 MG tablet Take 1 tablet (1,000 mg total) by mouth 2 (two) times daily with a meal. (Patient not taking: Reported on 07/31/2024)   No facility-administered encounter medications on file as of 07/31/2024.    Review of Systems:  Review of Systems  Constitutional:  Negative for appetite change, chills, fatigue and  fever.  HENT:  Negative for congestion, hearing loss, rhinorrhea and sore throat.   Eyes: Negative.   Respiratory:  Negative for cough, shortness of breath and wheezing.   Cardiovascular:  Negative for chest pain, palpitations and leg swelling.  Gastrointestinal:  Positive for abdominal pain. Negative for constipation, diarrhea, nausea and vomiting.  Genitourinary:  Negative for dysuria.  Musculoskeletal:  Negative for arthralgias, back pain and myalgias.  Skin:  Negative for color change, rash and wound.  Neurological:  Negative for dizziness, weakness and headaches.  Psychiatric/Behavioral:  Negative for behavioral problems. The patient is not nervous/anxious.     Health Maintenance  Topic Date Due   Medicare Annual Wellness (AWV)  Never done   FOOT EXAM  Never done   OPHTHALMOLOGY EXAM  Never done   DTaP/Tdap/Td (1 - Tdap) Never done   Mammogram  Never done   Colonoscopy  Never done   Zoster Vaccines- Shingrix (1 of 2) Never done   Bone Density Scan  Never done   COVID-19 Vaccine (1 - 2025-26 season) Never done   Pneumococcal Vaccine: 50+ Years (1 of 2 - PCV) 07/11/2025 (Originally 01/04/1975)   HEMOGLOBIN A1C  01/08/2025   Diabetic kidney evaluation - Urine ACR  07/25/2025   Diabetic kidney evaluation - eGFR measurement  07/28/2025   Influenza Vaccine  Completed   Hepatitis C Screening  Completed   Meningococcal B Vaccine  Aged Out    Physical Exam: Vitals:   07/31/24 0835  BP: 130/72  Pulse: 83  Temp: 97.9 F (36.6 C)  SpO2: 98%  Weight: 151 lb 12.8 oz (68.9 kg)  Height: 5' 3 (1.6 m)   Body mass index is 26.89 kg/m. Physical Exam Constitutional:      Appearance: Normal appearance.  HENT:     Head: Normocephalic and atraumatic.     Nose: Nose normal.     Mouth/Throat:     Mouth: Mucous membranes are moist.  Eyes:     Conjunctiva/sclera: Conjunctivae normal.  Cardiovascular:     Rate and Rhythm: Normal rate and regular rhythm.  Pulmonary:     Effort:  Pulmonary effort is normal.     Breath sounds: Normal breath sounds.  Abdominal:     General: Bowel sounds are normal.     Palpations: Abdomen is soft.  Musculoskeletal:        General: Normal range of motion.     Cervical back: Normal range of motion.  Skin:    General: Skin is warm and dry.  Neurological:     General: No focal deficit present.     Mental Status: She is alert and oriented to person, place, and time.  Psychiatric:        Mood and Affect: Mood normal.        Behavior: Behavior normal.        Thought Content: Thought content  normal.        Judgment: Judgment normal.     Labs reviewed: Basic Metabolic Panel: Recent Labs    07/11/24 0933 07/28/24 0318  NA 140 139  K 4.2 3.5  CL 102 100  CO2 28 26  GLUCOSE 119* 144*  BUN 8 11  CREATININE 0.73 0.79  CALCIUM 10.4 9.7  TSH 1.74  --    Liver Function Tests: Recent Labs    07/11/24 0933 07/28/24 0318  AST 32 52*  ALT 21 34  ALKPHOS  --  72  BILITOT 1.0 1.2  PROT 8.0 7.7  ALBUMIN  --  3.9   Recent Labs    07/28/24 0318  LIPASE 38   No results for input(s): AMMONIA in the last 8760 hours. CBC: Recent Labs    07/11/24 0933 07/28/24 0318  WBC 7.1 7.8  NEUTROABS 4,125  --   HGB 13.8 13.7  HCT 44.0 42.7  MCV 77.7* 76.5*  PLT 384 342   Lipid Panel: Recent Labs    07/11/24 0933  CHOL 246*  HDL 38*  LDLCALC 160*  TRIG 290*  CHOLHDL 6.5*   Lab Results  Component Value Date   HGBA1C 7.6 (H) 07/11/2024    Procedures since last visit: CT ABDOMEN PELVIS W CONTRAST Result Date: 07/28/2024 CLINICAL DATA:  Abdominal pain, acute, nonlocalized EXAM: CT ABDOMEN AND PELVIS WITH CONTRAST TECHNIQUE: Multidetector CT imaging of the abdomen and pelvis was performed using the standard protocol following bolus administration of intravenous contrast. RADIATION DOSE REDUCTION: This exam was performed according to the departmental dose-optimization program which includes automated exposure control,  adjustment of the mA and/or kV according to patient size and/or use of iterative reconstruction technique. CONTRAST:  75mL OMNIPAQUE  IOHEXOL  350 MG/ML SOLN COMPARISON:  None Available. FINDINGS: Lower chest: Ground-glass changes at the left lung base are nonspecific and could reflect mosaic attenuation versus infectious/inflammatory process. Hepatobiliary: Diffusely decreased attenuation of the a patent parenchyma, suggestive of hepatic steatosis. No suspicious focal hepatic lesion. Gallbladder is unremarkable. No biliary dilatation. Pancreas: Unremarkable. No pancreatic ductal dilatation or surrounding inflammatory changes. Spleen: Normal in size without focal abnormality. Adrenals/Urinary Tract: Adrenal glands are unremarkable. A few bilateral subcentimeter focal hypodensities are too small to definitively characterize. No urolithiasis or hydronephrosis. Bladder is unremarkable. Stomach/Bowel: Stomach is within normal limits. No evidence of obstruction or inflammatory changes. Appendix appears normal. Mild sigmoid colonic diverticula. Vascular/Lymphatic: Abdominal aorta is normal in caliber with atherosclerotic calcification. No enlarged abdominal or pelvic lymph nodes. Reproductive: Uterus and bilateral adnexa are unremarkable. Other: No abdominopelvic ascites. No intraperitoneal free air. No abdominal wall hernia. Musculoskeletal: No acute osseous abnormality. No suspicious osseous lesion. IMPRESSION: 1. No acute localizing findings in the abdomen or pelvis. 2. Mild sigmoid colonic diverticulosis. 3. Hepatic steatosis. 4. Ground-glass changes at the left lung base are nonspecific and could reflect mosaic attenuation versus an infectious/inflammatory process. 5.  Aortic Atherosclerosis (ICD10-I70.0). Electronically Signed   By: Harrietta Sherry M.D.   On: 07/28/2024 14:05   DG HANDS 1 VIEW BILAT BALLCATCHERS Result Date: 07/18/2024 CLINICAL DATA:  Bilateral hand pain for 4-5 years. No previous relevant  surgery. EXAM: HAND LIMITED 1 VIEW COMPARISON:  None Available. FINDINGS: The mineralization and alignment are normal. There is no evidence of acute fracture or dislocation. Mild radiocarpal and interphalangeal joint space narrowing in both hands. The metacarpal phalangeal and intercarpal joint spaces are preserved. No erosive changes or focal soft tissue abnormalities are seen. IMPRESSION: Mild degenerative changes in both hands  as described. No acute osseous findings or evidence of inflammatory arthropathy. Electronically Signed   By: Elsie Perone M.D.   On: 07/18/2024 16:31    Assessment/Plan  1. Type 2 diabetes mellitus with other specified complication, without long-term current use of insulin (HCC) (Primary) -  Metformin  discontinued due to adverse effects. Blood work and urine tests negative for other causes. - Discontinued metformin  and added to allergy list. - Encouraged exercise for at least 150 minutes per week. - Advised low carbohydrate diet. - Will repeat A1c in three months.  2. Mixed hyperlipidemia Lab Results  Component Value Date   HGBA1C 7.6 (H) 07/11/2024    -  Not on statin therapy due to potential interaction with Colchicine . Plan to reassess cholesterol levels before initiating medication. - Encouraged exercise and low-fat diet. - Will repeat cholesterol levels in February.  3. Gastroesophageal reflux disease without esophagitis -  Intermittent chest pain likely due to acid reflux. Pantoprazole  prescribed. - Continue pantoprazole  as needed for acid reflux.  4. Acute idiopathic gout of hand, unspecified laterality -  Gout flare in the hand with swelling and pain. Colchicine  prescribed. - Continue colchicine  daily X 2 weeks    Labs/tests ordered:   None   Return in about 3 months (around 10/29/2024).  Ahmya Bernick Medina-Vargas, NP     [1]  Allergies Allergen Reactions   Metformin  And Related

## 2024-08-14 ENCOUNTER — Other Ambulatory Visit: Payer: Self-pay | Admitting: Adult Health

## 2024-08-14 DIAGNOSIS — M10049 Idiopathic gout, unspecified hand: Secondary | ICD-10-CM

## 2024-08-14 MED ORDER — COLCHICINE 0.6 MG PO TABS: 0.6000 mg | ORAL_TABLET | Freq: Every day | ORAL | 0 refills | Status: DC

## 2024-08-14 NOTE — Telephone Encounter (Signed)
 Copied from CRM #[id number]. Topic: Clinical - Medication Refill >> Aug 14, 2024 10:17 AM Antonio H wrote: Medication: colchicine  0.6 MG tablet  Has the patient contacted their pharmacy? No (Agent: If no, request that the patient contact the pharmacy for the refill. If patient does not wish to contact the pharmacy document the reason why and proceed with request.) (Agent: If yes, when and what did the pharmacy advise?)  This is the patient's preferred pharmacy:  [id number] - [place], [place] - [address] AT [facility] [address] [place] [address] Phone: [phone] Fax: [phone]  Is this the correct pharmacy for this prescription? Yes If no, delete pharmacy and type the correct one.   Has the prescription been filled recently? No  Is the patient out of the medication? Has 1-2 pills left  Has the patient been seen for an appointment in the last year OR does the patient have an upcoming appointment? Yes, has apt in March   Can we respond through MyChart? No  Agent: Please be advised that Rx refills may take up to 3 business days. We ask that you follow-up with your pharmacy. >> Aug 14, 2024 10:22 AM Antonio DEL wrote: Patient's daughter Glenys said to let her know if medication can be filled, she would like a call to be informed so she can let her mom know.She said the pharmacy didn't call last time. Phone number is [phone]

## 2024-08-14 NOTE — Telephone Encounter (Unsigned)
 Copied from CRM #[id number]. Topic: Clinical - Medication Refill >> Aug 14, 2024 10:17 AM Antonio H wrote: Medication: colchicine  0.6 MG tablet  Has the patient contacted their pharmacy? No (Agent: If no, request that the patient contact the pharmacy for the refill. If patient does not wish to contact the pharmacy document the reason why and proceed with request.) (Agent: If yes, when and what did the pharmacy advise?)  This is the patient's preferred pharmacy:  [id number] - [place], [place] - [address] AT [facility] [address] [place] [address] Phone: [phone] Fax: [phone]  Is this the correct pharmacy for this prescription? Yes If no, delete pharmacy and type the correct one.   Has the prescription been filled recently? No  Is the patient out of the medication? Has 1-2 pills left  Has the patient been seen for an appointment in the last year OR does the patient have an upcoming appointment? Yes, has apt in March   Can we respond through MyChart? No  Agent: Please be advised that Rx refills may take up to 3 business days. We ask that you follow-up with your pharmacy.

## 2024-08-22 ENCOUNTER — Ambulatory Visit
Admission: RE | Admit: 2024-08-22 | Discharge: 2024-08-22 | Disposition: A | Source: Ambulatory Visit | Attending: Adult Health | Admitting: Adult Health

## 2024-08-24 ENCOUNTER — Other Ambulatory Visit: Payer: Self-pay | Admitting: Adult Health

## 2024-08-24 DIAGNOSIS — R928 Other abnormal and inconclusive findings on diagnostic imaging of breast: Secondary | ICD-10-CM

## 2024-08-28 ENCOUNTER — Other Ambulatory Visit: Payer: Self-pay | Admitting: Adult Health

## 2024-08-28 DIAGNOSIS — M10049 Idiopathic gout, unspecified hand: Secondary | ICD-10-CM

## 2024-08-28 MED ORDER — COLCHICINE 0.6 MG PO TABS: 0.6000 mg | ORAL_TABLET | Freq: Every day | ORAL | 0 refills | Status: AC

## 2024-08-29 ENCOUNTER — Ambulatory Visit: Admitting: Adult Health

## 2024-09-14 ENCOUNTER — Inpatient Hospital Stay: Admission: RE | Admit: 2024-09-14 | Source: Ambulatory Visit

## 2024-09-18 ENCOUNTER — Encounter: Payer: Self-pay | Admitting: Gastroenterology

## 2024-10-16 ENCOUNTER — Ambulatory Visit: Admitting: Adult Health
